# Patient Record
Sex: Male | Born: 1940 | Race: White | Hispanic: No | Marital: Single | Smoking: Former smoker
Health system: Southern US, Community
[De-identification: ages and names within clinical notes are randomized; demographics above are authoritative.]

## PROBLEM LIST (undated history)

## (undated) DIAGNOSIS — F329 Major depressive disorder, single episode, unspecified: Secondary | ICD-10-CM

## (undated) DIAGNOSIS — F32A Depression, unspecified: Secondary | ICD-10-CM

## (undated) HISTORY — PX: COLONOSCOPY: SHX174

## (undated) HISTORY — PX: KNEE SURGERY: SHX244

---

## 2004-10-14 ENCOUNTER — Ambulatory Visit: Payer: Self-pay | Admitting: Gastroenterology

## 2004-10-27 ENCOUNTER — Ambulatory Visit: Payer: Self-pay | Admitting: Gastroenterology

## 2004-12-06 ENCOUNTER — Emergency Department (HOSPITAL_COMMUNITY): Admission: EM | Admit: 2004-12-06 | Discharge: 2004-12-06 | Payer: Self-pay | Admitting: Emergency Medicine
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
## 2009-09-19 ENCOUNTER — Encounter (INDEPENDENT_AMBULATORY_CARE_PROVIDER_SITE_OTHER): Payer: Self-pay | Admitting: *Deleted

## 2009-09-23 ENCOUNTER — Encounter (INDEPENDENT_AMBULATORY_CARE_PROVIDER_SITE_OTHER): Payer: Self-pay | Admitting: *Deleted

## 2009-11-07 ENCOUNTER — Encounter (INDEPENDENT_AMBULATORY_CARE_PROVIDER_SITE_OTHER): Payer: Self-pay | Admitting: *Deleted

## 2009-11-10 ENCOUNTER — Ambulatory Visit: Payer: Self-pay | Admitting: Gastroenterology

## 2009-11-18 ENCOUNTER — Ambulatory Visit: Payer: Self-pay | Admitting: Gastroenterology

## 2010-04-28 NOTE — Procedures (Signed)
Summary: Colonoscopy  Patient: Benjamin Lowe Note: All result statuses are Final unless otherwise noted.  Tests: (1) Colonoscopy (COL)   COL Colonoscopy           DONE (C)     [facility]     [address]     [place], [place]  [address]           COLONOSCOPY PROCEDURE REPORT           PATIENT:  Benjamin Lowe, Benjamin Lowe  MR#:  [id number]     BIRTHDATE:  1941-01-22, 69 yrs. old  GENDER:  male     ENDOSCOPIST:  Judie Petit T. Russella Dar, MD, [facility]           PROCEDURE DATE:  11/18/2009     PROCEDURE:  Colonoscopy 19147     ASA CLASS:  Class II     INDICATIONS:  1) surveillance and high-risk screening  2) family     history of colon cancer  3) follow-up of polyp, polyp not     retrieved at 10/2004 colonoscopy. Father with colon cancer at 75.     MEDICATIONS:   Fentanyl 100 mcg IV, Versed 8 mg IV     DESCRIPTION OF PROCEDURE:   After the risks benefits and     alternatives of the procedure were thoroughly explained, informed     consent was obtained.  Digital rectal exam was performed and     revealed no abnormalities.   The LB PCF-H180AL [id number] endoscope     was introduced through the anus and advanced to the cecum, which     was identified by both the appendix and ileocecal valve, without     limitations.  The quality of the prep was good, using MoviPrep.     The instrument was then slowly withdrawn as the colon was fully     examined.     <<PROCEDUREIMAGES>>     FINDINGS:  A normal appearing cecum, ileocecal valve, and     appendiceal orifice were identified. The ascending, hepatic     flexure, transverse, splenic flexure, descending, sigmoid colon,     and rectum appeared unremarkable. Retroflexed views in the rectum     revealed no abnormalities. The time to cecum =  2  minutes. The     scope was then withdrawn (time =  9  min) from the patient and the     procedure completed.     COMPLICATIONS:  None           ENDOSCOPIC IMPRESSION:     1) Normal colon        RECOMMENDATIONS:     1) Repeat Colonoscopy in 5 years.           Venita Lick. Russella Dar, MD, [facility]           CC: Lilyan Punt, MD           n.     REVISED:  11/18/2009 11:22 AM     eSIGNED:   Judie Petit T. Stark at 11/18/2009 11:22 AM           Peri Maris, [id number]  Note: An exclamation mark (!) indicates a result that was not dispersed into the flowsheet. Document Creation Date: 11/18/2009 11:23 AM _______________________________________________________________________  (1) Order result status: Final Collection or observation date-time: 11/18/2009 11:10 Requested date-time:  Receipt date-time:  Reported date-time:  Referring Physician:   Ordering Physician: Claudette Head [phone]) Specimen Source:  Source: Launa Grill Order Number: [id number] Lab  site:   Appended Document: Colonoscopy    Clinical Lists Changes  Observations: Added new observation of COLONNXTDUE: 10/2014 (11/18/2009 14:42)

## 2010-04-28 NOTE — Miscellaneous (Signed)
Summary: recall col...em  Clinical Lists Changes  Medications: Added new medication of MOVIPREP 100 GM  SOLR (PEG-KCL-NACL-NASULF-NA ASC-C) As directed - Signed Rx of MOVIPREP 100 GM  SOLR (PEG-KCL-NACL-NASULF-NA ASC-C) As directed;  #1 x 0;  Signed;  Entered by: Clide Cliff RN;  Authorized by: Meryl Dare MD [facility];  Method used: Electronically to [facility] [address], [place], [place], [place]  [address], Ph: [phone], Fax: [phone] Observations: Added new observation of ALLERGY REV: Done (11/10/2009 9:41)    Prescriptions: MOVIPREP 100 GM  SOLR (PEG-KCL-NACL-NASULF-NA ASC-C) As directed  #1 x 0   Entered by:   Clide Cliff RN   Authorized by:   Meryl Dare MD [facility]   Signed by:   Clide Cliff RN on 11/10/2009   Method used:   Electronically to        [facility]  [address] (retail)       [address]       [place], [place]  [address]       Ph: [phone]       Fax: [phone]   RxID:   [id number]

## 2010-04-28 NOTE — Letter (Signed)
Summary: Previsit letter  [facility]  [address] [place], [place] [address]   Phone: [phone]  Fax: [phone]       09/23/2009 MRN: [id number]  [facility] [address] [place], [place]  [address]  Dear Benjamin Lowe,  Welcome to the Gastroenterology Division at [facility].    You are scheduled to see a nurse for your pre-procedure visit on November 10, 2009 at 10:00am on the 3rd floor at [facility], [address]  We ask that you try to arrive at our office 15 minutes prior to your appointment time to allow for check-in.  Your nurse visit will consist of discussing your medical and surgical history, your immediate family medical history, and your medications.    Please bring a complete list of all your medications or, if you prefer, bring the medication bottles and we will list them.  We will need to be aware of both prescribed and over the counter drugs.  We will need to know exact dosage information as well.  If you are on blood thinners (Coumadin, Plavix, Aggrenox, Ticlid, etc.) please call our office today/prior to your appointment, as we need to consult with your physician about holding your medication.   Please be prepared to read and sign documents such as consent forms, a financial agreement, and acknowledgement forms.  If necessary, and with your consent, a friend or relative is welcome to sit-in on the nurse visit with you.  Please bring your insurance card so that we may make a copy of it.  If your insurance requires a referral to see a specialist, please bring your referral form from your primary care physician.  No co-pay is required for this nurse visit.     If you cannot keep your appointment, please call [phone] to cancel or reschedule prior to your appointment date.  This allows [place] the opportunity to schedule an appointment for another patient in need of care.    Thank you for choosing [facility] for your medical  needs.  We appreciate the opportunity to care for you.  Please visit [place] at our website  to learn more about our practice.                     Sincerely.                                                                                                                   The Gastroenterology Division

## 2010-04-28 NOTE — Letter (Signed)
Summary: Colonoscopy Letter  [facility]  [address] [place], [place] [address]   Phone: [phone]  Fax: [phone]      September 19, 2009 MRN: [id number]   [facility] [address] [place], [place]  [address]   Dear Mr. Gorin,   According to your medical record, it is time for you to schedule a Colonoscopy. The American Cancer Society recommends this procedure as a method to detect early colon cancer. Patients with a family history of colon cancer, or a personal history of colon polyps or inflammatory bowel disease are at increased risk.  This letter has beeen generated based on the recommendations made at the time of your procedure. If you feel that in your particular situation this may no longer apply, please contact our office.  Please call our office at [phone] to schedule this appointment or to update your records at your earliest convenience.  Thank you for cooperating with [place] to provide you with the very best care possible.   Sincerely,  Judie Petit T. Russella Dar, M.D.  [facility] [phone]

## 2010-04-28 NOTE — Letter (Signed)
Summary: [facility] Instructions  [facility]  [address] [place], [place] [address]   Phone: [phone]  Fax: [phone]       ZAXTON ANGERER    [birth year]    MRN: [id number]        Procedure Day Dorna Bloom:  Jake Shark  11/18/09     Arrival Time:  9:30AM     Procedure Time:  10:30AM     Location of Procedure:                    _X _  [facility] (4th Floor)                       PREPARATION FOR COLONOSCOPY WITH MOVIPREP   Starting 5 days prior to your procedure 11/13/09 do not eat nuts, seeds, popcorn, corn, beans, peas,  salads, or any raw vegetables.  Do not take any fiber supplements (e.g. Metamucil, Citrucel, and Benefiber).  THE DAY BEFORE YOUR PROCEDURE         DATE: 11/17/09  DAY: MONDAY  1.  Drink clear liquids the entire day-NO SOLID FOOD  2.  Do not drink anything colored red or purple.  Avoid juices with pulp.  No orange juice.  3.  Drink at least 64 oz. (8 glasses) of fluid/clear liquids during the day to prevent dehydration and help the prep work efficiently.  CLEAR LIQUIDS INCLUDE: Water Jello Ice Popsicles Tea (sugar ok, no milk/cream) Powdered fruit flavored drinks Coffee (sugar ok, no milk/cream) Gatorade Juice: apple, white grape, white cranberry  Lemonade Clear bullion, consomm, broth Carbonated beverages (any kind) Strained chicken noodle soup Hard Candy                             4.  In the morning, mix first dose of MoviPrep solution:    Empty 1 Pouch A and 1 Pouch B into the disposable container    Add lukewarm drinking water to the top line of the container. Mix to dissolve    Refrigerate (mixed solution should be used within 24 hrs)  5.  Begin drinking the prep at 5:00 p.m. The MoviPrep container is divided by 4 marks.   Every 15 minutes drink the solution down to the next mark (approximately 8 oz) until the full liter is complete.   6.  Follow completed prep with 16 oz of clear liquid of your choice  (Nothing red or purple).  Continue to drink clear liquids until bedtime.  7.  Before going to bed, mix second dose of MoviPrep solution:    Empty 1 Pouch A and 1 Pouch B into the disposable container    Add lukewarm drinking water to the top line of the container. Mix to dissolve    Refrigerate  THE DAY OF YOUR PROCEDURE      DATE: 11/18/09   DAY: TUESDAY  Beginning at 5:30AM (5 hours before procedure):         1. Every 15 minutes, drink the solution down to the next mark (approx 8 oz) until the full liter is complete.  2. Follow completed prep with 16 oz. of clear liquid of your choice.    3. You may drink clear liquids until 8:30AM (2 HOURS BEFORE PROCEDURE).   MEDICATION INSTRUCTIONS  Unless otherwise instructed, you should take regular prescription medications with a small sip of water   as early as possible the morning  of your procedure.         OTHER INSTRUCTIONS  You will need a responsible adult at least 71 years of age to accompany you and drive you home.   This person must remain in the waiting room during your procedure.  Wear loose fitting clothing that is easily removed.  Leave jewelry and other valuables at home.  However, you may wish to bring a book to read or  an iPod/MP3 player to listen to music as you wait for your procedure to start.  Remove all body piercing jewelry and leave at home.  Total time from sign-in until discharge is approximately 2-3 hours.  You should go home directly after your procedure and rest.  You can resume normal activities the  day after your procedure.  The day of your procedure you should not:   Drive   Make legal decisions   Operate machinery   Drink alcohol   Return to work  You will receive specific instructions about eating, activities and medications before you leave.    The above instructions have been reviewed and explained to me by   Clide Cliff, RN______________________    I fully understand  and can verbalize these instructions _____________________________ Date _________

## 2012-09-04 ENCOUNTER — Telehealth: Payer: Self-pay | Admitting: Family Medicine

## 2012-09-04 NOTE — Telephone Encounter (Signed)
Patient needs paperwork for blood work.  Please call patient paperwork is ready to be picked up.  Thanks

## 2012-09-05 ENCOUNTER — Encounter: Payer: Self-pay | Admitting: *Deleted

## 2012-09-05 ENCOUNTER — Other Ambulatory Visit: Payer: Self-pay | Admitting: *Deleted

## 2012-09-05 DIAGNOSIS — Z125 Encounter for screening for malignant neoplasm of prostate: Secondary | ICD-10-CM

## 2012-09-05 DIAGNOSIS — Z0000 Encounter for general adult medical examination without abnormal findings: Secondary | ICD-10-CM

## 2012-09-05 NOTE — Telephone Encounter (Signed)
bloodwork papers for Lipid, liver, psa, and bmp ready for pick up pt notified

## 2012-09-28 LAB — BASIC METABOLIC PANEL
Chloride: 103 mEq/L (ref 96–112)
Potassium: 4.2 mEq/L (ref 3.5–5.3)
Sodium: 140 mEq/L (ref 135–145)

## 2012-09-28 LAB — LIPID PANEL
HDL: 45 mg/dL (ref >39)
Total CHOL/HDL Ratio: 3.8 Ratio
Triglycerides: 107 mg/dL (ref <150)
VLDL: 21 mg/dL (ref 0–40)

## 2012-09-28 LAB — HEPATIC FUNCTION PANEL
AST: 16 U/L (ref 0–37)
Total Bilirubin: 0.8 mg/dL (ref 0.3–1.2)

## 2012-10-23 ENCOUNTER — Ambulatory Visit (INDEPENDENT_AMBULATORY_CARE_PROVIDER_SITE_OTHER): Payer: No Typology Code available for payment source | Admitting: Family Medicine

## 2012-10-23 ENCOUNTER — Encounter: Payer: Self-pay | Admitting: Family Medicine

## 2012-10-23 VITALS — BP 148/84 | HR 70 | Ht 68.5 in | Wt 183.0 lb

## 2012-10-23 DIAGNOSIS — F411 Generalized anxiety disorder: Secondary | ICD-10-CM

## 2012-10-23 DIAGNOSIS — Z0000 Encounter for general adult medical examination without abnormal findings: Secondary | ICD-10-CM

## 2012-10-23 NOTE — Progress Notes (Signed)
  Subjective:    Patient ID: Benjamin Lowe, male    DOB: November 21, 1940, 72 y.o.   MRN: [id number]  HPI Here for a physical. Here for wellness exam via Medicare. No accidents. Recall is 3 for 3 clock drawing good. He denies any specific problems or issues currently. Up-to-date on colonoscopy at this present moment he does not want to do one at [place] again. He eats properly exercises on a regular basis. Safety measures dietary measures all discussed.  Review of Systems  Constitutional: Negative for fever, activity change and appetite change.  HENT: Negative for congestion, rhinorrhea and neck pain.   Eyes: Negative for discharge.  Respiratory: Negative for cough and wheezing.   Cardiovascular: Negative for chest pain.  Gastrointestinal: Negative for vomiting, abdominal pain and blood in stool.  Genitourinary: Negative for frequency and difficulty urinating.  Skin: Negative for rash.  Allergic/Immunologic: Negative for environmental allergies and food allergies.  Neurological: Negative for weakness and headaches.  Psychiatric/Behavioral: Negative for agitation.   Past medical history family history social history all reviewed    Objective:   Physical Exam  Constitutional: He appears well-developed and well-nourished.  HENT:  Head: Normocephalic and atraumatic.  Right Ear: External ear normal.  Left Ear: External ear normal.  Nose: Nose normal.  Mouth/Throat: Oropharynx is clear and moist.  Eyes: EOM are normal. Pupils are equal, round, and reactive to light.  Neck: Normal range of motion. Neck supple. No thyromegaly present.  Cardiovascular: Normal rate, regular rhythm and normal heart sounds.   No murmur heard. Pulmonary/Chest: Effort normal and breath sounds normal. No respiratory distress. He has no wheezes.  Abdominal: Soft. Bowel sounds are normal. He exhibits no distension and no mass. There is no tenderness.  Genitourinary: Penis normal.  Musculoskeletal: Normal range of  motion. He exhibits no edema.  Lymphadenopathy:    He has no cervical adenopathy.  Neurological: He is alert. He exhibits normal muscle tone.  Skin: Skin is warm and dry. No erythema.  Psychiatric: He has a normal mood and affect. His behavior is normal. Judgment normal.          Assessment & Plan:  Patient most at risk for accidents just mainly based on age. I encouraged him to drive safe thing through anything he is doing.  Dietary measures discussed in detail.  Followup for wellness exam in one year.

## 2012-10-28 ENCOUNTER — Other Ambulatory Visit: Payer: Self-pay | Admitting: Family Medicine

## 2012-12-21 ENCOUNTER — Telehealth: Payer: Self-pay | Admitting: Family Medicine

## 2012-12-21 MED ORDER — MELOXICAM 15 MG PO TABS: 15.0000 mg | ORAL_TABLET | Freq: Every day | ORAL | Status: DC | PRN

## 2012-12-21 NOTE — Telephone Encounter (Signed)
May call in Mobic 15 mg, #30, 1 daily as needed followup if ongoing

## 2012-12-21 NOTE — Telephone Encounter (Signed)
Patient is wanting to know if he can take 2 aleve for his sciatic pain .Marland Kitchen Also, other options for something stronger if needed.

## 2012-12-21 NOTE — Telephone Encounter (Signed)
Left message on voicemail notifying patient that medication has been sent to pharmacy.  

## 2012-12-21 NOTE — Telephone Encounter (Signed)
Patient states that the aleve is not helping him much at all. He wants to know if you can prescribe something stronger for the pain.

## 2012-12-21 NOTE — Telephone Encounter (Signed)
Yes for occasional use

## 2013-01-17 ENCOUNTER — Other Ambulatory Visit: Payer: Self-pay | Admitting: Family Medicine

## 2013-01-17 NOTE — Telephone Encounter (Signed)
Refill x4 

## 2013-03-20 ENCOUNTER — Other Ambulatory Visit: Payer: Self-pay | Admitting: Family Medicine

## 2013-03-20 NOTE — Telephone Encounter (Signed)
Last office visit 10/23/12

## 2013-05-17 ENCOUNTER — Ambulatory Visit (INDEPENDENT_AMBULATORY_CARE_PROVIDER_SITE_OTHER): Payer: Medicare HMO | Admitting: Family Medicine

## 2013-05-17 ENCOUNTER — Encounter: Payer: Self-pay | Admitting: Family Medicine

## 2013-05-17 VITALS — BP 130/94 | Temp 98.6°F | Ht 68.5 in | Wt 180.4 lb

## 2013-05-17 DIAGNOSIS — K2990 Gastroduodenitis, unspecified, without bleeding: Principal | ICD-10-CM

## 2013-05-17 DIAGNOSIS — K2970 Gastritis, unspecified, without bleeding: Secondary | ICD-10-CM

## 2013-05-17 MED ORDER — PANTOPRAZOLE SODIUM 40 MG PO TBEC: 40.0000 mg | DELAYED_RELEASE_TABLET | Freq: Every day | ORAL | Status: DC

## 2013-05-17 MED ORDER — ALPRAZOLAM 0.25 MG PO TABS: ORAL_TABLET | ORAL | Status: DC

## 2013-05-17 MED ORDER — SILDENAFIL CITRATE 100 MG PO TABS: ORAL_TABLET | ORAL | Status: DC

## 2013-05-17 NOTE — Progress Notes (Signed)
   Subjective:    Patient ID: Benjamin Lowe, male    DOB: [birth year], 73 y.o.   MRN: [id number]  Abdominal Pain This is a new problem. The current episode started 1 to 4 weeks ago. The problem occurs intermittently. The problem has been unchanged. The pain is located in the generalized abdominal region. The pain is at a severity of 0/10. The patient is experiencing no pain. The quality of the pain is a sensation of fullness. The abdominal pain does not radiate. Associated symptoms include belching, flatus and nausea. Pertinent negatives include no fever or vomiting. Nothing aggravates the pain. The pain is relieved by passing flatus and belching. He has tried antacids for the symptoms. The treatment provided no relief.   PMH benign/anxiety tendencies   Review of Systems  Constitutional: Negative for fever and activity change.  HENT: Negative for congestion, ear pain and rhinorrhea.   Eyes: Negative for discharge.  Respiratory: Positive for cough. Negative for wheezing.   Cardiovascular: Negative for chest pain.  Gastrointestinal: Positive for nausea, abdominal pain and flatus. Negative for vomiting and blood in stool.       Objective:   Physical Exam  Nursing note and vitals reviewed. Constitutional: He appears well-developed.  HENT:  Head: Normocephalic.  Mouth/Throat: Oropharynx is clear and moist. No oropharyngeal exudate.  Neck: Normal range of motion.  Cardiovascular: Normal rate, regular rhythm and normal heart sounds.   No murmur heard. Pulmonary/Chest: Effort normal and breath sounds normal. He has no wheezes.  Abdominal: Soft. He exhibits no distension and no mass. There is no tenderness. There is no rebound and no guarding.  Lymphadenopathy:    He has no cervical adenopathy.  Neurological: He exhibits normal muscle tone.  Skin: Skin is warm and dry.          Assessment & Plan:  Abdominal discomfort-intermittent mainly epigastric PP I prescribed. Warning signs  discussed. Patient has tendency to worry this complicates things I did advise the patient to followup again in 3-4 weeks if he is not improving we will do some testing possibly ultrasound possibly lab work but for now this is not necessary. 25 minutes spent with patient discussing these issues  Refills on Xanax and Viagra given

## 2013-06-07 ENCOUNTER — Ambulatory Visit (INDEPENDENT_AMBULATORY_CARE_PROVIDER_SITE_OTHER): Payer: Medicare HMO | Admitting: Family Medicine

## 2013-06-07 ENCOUNTER — Encounter: Payer: Self-pay | Admitting: Family Medicine

## 2013-06-07 VITALS — BP 148/80 | Ht 68.5 in | Wt 178.0 lb

## 2013-06-07 DIAGNOSIS — K2970 Gastritis, unspecified, without bleeding: Secondary | ICD-10-CM

## 2013-06-07 DIAGNOSIS — K2990 Gastroduodenitis, unspecified, without bleeding: Principal | ICD-10-CM

## 2013-06-07 NOTE — Progress Notes (Signed)
   Subjective:    Patient ID: Benjamin Lowe, male    DOB: 09-19-1940, 73 y.o.   MRN: [id number]  HPI Patient is here today for a f/u on his GI problems. Patient is nervous about what's going on he worries about dying.  He said he is feeling better and no longer has abdominal pain.   Patient denies rectal bleeding vomiting diarrhea sweats chills or fever.  Review of Systems    see above. Objective:   Physical Exam  Lungs clear hearts regular and soft no guarding rebound tenderness  Blood pressure recheck very good.    Assessment & Plan:  Gastritis-resolved. Patient is take protonic for a total of 3 months then stop the medicine. Followup if ongoing trouble followup later this year for a wellness exam.

## 2013-08-23 ENCOUNTER — Other Ambulatory Visit: Payer: Self-pay | Admitting: Family Medicine

## 2013-09-20 ENCOUNTER — Telehealth: Payer: Self-pay | Admitting: Family Medicine

## 2013-09-20 DIAGNOSIS — Z125 Encounter for screening for malignant neoplasm of prostate: Secondary | ICD-10-CM

## 2013-09-20 DIAGNOSIS — Z0189 Encounter for other specified special examinations: Secondary | ICD-10-CM

## 2013-09-20 DIAGNOSIS — Z79899 Other long term (current) drug therapy: Secondary | ICD-10-CM

## 2013-09-20 NOTE — Telephone Encounter (Signed)
BW orders in. Patient notified and verbalized understanding

## 2013-09-20 NOTE — Telephone Encounter (Signed)
Lipid, Liver, Met 7, PSA on 09/27/12

## 2013-09-20 NOTE — Telephone Encounter (Signed)
Same test but add CBC

## 2013-09-20 NOTE — Telephone Encounter (Signed)
Pt would like bw papers for appt   Call when done

## 2013-09-21 LAB — LIPID PANEL
CHOL/HDL RATIO: 3.6 ratio
Cholesterol: 185 mg/dL (ref 0–200)
HDL: 51 mg/dL (ref >39)
LDL CALC: 107 mg/dL — AB (ref 0–99)
TRIGLYCERIDES: 134 mg/dL (ref <150)
VLDL: 27 mg/dL (ref 0–40)

## 2013-09-21 LAB — HEPATIC FUNCTION PANEL
ALBUMIN: 4.3 g/dL (ref 3.5–5.2)
ALK PHOS: 74 U/L (ref 39–117)
ALT: 13 U/L (ref 0–53)
AST: 15 U/L (ref 0–37)
BILIRUBIN INDIRECT: 0.6 mg/dL (ref 0.2–1.2)
Bilirubin, Direct: 0.2 mg/dL (ref 0.0–0.3)
Total Bilirubin: 0.8 mg/dL (ref 0.2–1.2)
Total Protein: 7.4 g/dL (ref 6.0–8.3)

## 2013-09-21 LAB — BASIC METABOLIC PANEL
BUN: 19 mg/dL (ref 6–23)
CHLORIDE: 109 meq/L (ref 96–112)
CO2: 28 mEq/L (ref 19–32)
Calcium: 9.4 mg/dL (ref 8.4–10.5)
Creat: 0.93 mg/dL (ref 0.50–1.35)
GLUCOSE: 82 mg/dL (ref 70–99)
Potassium: 4.3 mEq/L (ref 3.5–5.3)
Sodium: 138 mEq/L (ref 135–145)

## 2013-09-21 LAB — CBC
HEMATOCRIT: 46.6 % (ref 39.0–52.0)
Hemoglobin: 15.7 g/dL (ref 13.0–17.0)
MCH: 31.6 pg (ref 26.0–34.0)
MCHC: 33.7 g/dL (ref 30.0–36.0)
MCV: 93.8 fL (ref 78.0–100.0)
Platelets: 194 10*3/uL (ref 150–400)
RBC: 4.97 MIL/uL (ref 4.22–5.81)
RDW: 13.3 % (ref 11.5–15.5)
WBC: 5.2 10*3/uL (ref 4.0–10.5)

## 2013-09-22 LAB — PSA, MEDICARE: PSA: 0.43 ng/mL (ref <=4.00)

## 2013-10-30 ENCOUNTER — Encounter: Payer: Self-pay | Admitting: Family Medicine

## 2013-10-30 ENCOUNTER — Ambulatory Visit (INDEPENDENT_AMBULATORY_CARE_PROVIDER_SITE_OTHER): Payer: Medicare HMO | Admitting: Family Medicine

## 2013-10-30 VITALS — BP 124/88 | Ht 69.0 in | Wt 180.0 lb

## 2013-10-30 DIAGNOSIS — Z0000 Encounter for general adult medical examination without abnormal findings: Secondary | ICD-10-CM

## 2013-10-30 DIAGNOSIS — Z23 Encounter for immunization: Secondary | ICD-10-CM

## 2013-10-30 MED ORDER — ALPRAZOLAM 0.25 MG PO TABS: ORAL_TABLET | ORAL | Status: DC

## 2013-10-30 MED ORDER — SILDENAFIL CITRATE 100 MG PO TABS: ORAL_TABLET | ORAL | Status: DC

## 2013-10-30 NOTE — Progress Notes (Signed)
   Subjective:    Patient ID: Benjamin Lowe, male    DOB: November 09, 1940, 73 y.o.   MRN: [id number]  HPI AWV- Annual Wellness Visit  The patient was seen for their annual wellness visit. The patient's past medical history, surgical history, and family history were reviewed. Pertinent vaccines were reviewed ( tetanus, pneumonia, shingles, flu) The patient's medication list was reviewed and updated.  The height and weight were entered. The patient's current BMI is: 26.58  Cognitive screening was completed. Outcome of Mini - Cog: passed  Falls within the past 6 months: none  Current tobacco usage: chew tobacco (All patients who use tobacco were given written and verbal information on quitting)  Recent listing of emergency department/hospitalizations over the past year were reviewed.  current specialist the patient sees on a regular basis: none   Medicare annual wellness visit patient questionnaire was reviewed.  A written screening schedule for the patient for the next 5-10 years was given. Appropriate discussion of followup regarding next visit was discussed.  Patient states he has no concerns at this time.     Review of Systems  Constitutional: Negative for fever, activity change and appetite change.  HENT: Negative for congestion and rhinorrhea.   Eyes: Negative for discharge.  Respiratory: Negative for cough and wheezing.   Cardiovascular: Negative for chest pain.  Gastrointestinal: Negative for vomiting, abdominal pain and blood in stool.  Genitourinary: Negative for frequency and difficulty urinating.  Musculoskeletal: Negative for neck pain.  Skin: Negative for rash.  Allergic/Immunologic: Negative for environmental allergies and food allergies.  Neurological: Negative for weakness and headaches.  Psychiatric/Behavioral: Negative for agitation.       Objective:   Physical Exam  Constitutional: He appears well-developed and well-nourished.  HENT:  Head:  Normocephalic and atraumatic.  Right Ear: External ear normal.  Left Ear: External ear normal.  Nose: Nose normal.  Mouth/Throat: Oropharynx is clear and moist.  Eyes: EOM are normal. Pupils are equal, round, and reactive to light.  Neck: Normal range of motion. Neck supple. No thyromegaly present.  Cardiovascular: Normal rate, regular rhythm and normal heart sounds.   No murmur heard. Pulmonary/Chest: Effort normal and breath sounds normal. No respiratory distress. He has no wheezes.  Abdominal: Soft. Bowel sounds are normal. He exhibits no distension and no mass. There is no tenderness.  Genitourinary: Rectum normal, prostate normal and penis normal.  Musculoskeletal: Normal range of motion. He exhibits no edema.  Lymphadenopathy:    He has no cervical adenopathy.  Neurological: He is alert. He exhibits normal muscle tone.  Skin: Skin is warm and dry. No erythema.  Psychiatric: He has a normal mood and affect. His behavior is normal. Judgment normal.          Assessment & Plan:  His overall health is doing very well he needs to continue healthy diet regular physical activity. There is no need for this patient to followup any time soon recheck 6 months with a wellness in one year. He is not due for any colonoscopy currently and was told so.

## 2013-12-06 ENCOUNTER — Other Ambulatory Visit: Payer: Self-pay | Admitting: Family Medicine

## 2013-12-06 NOTE — Telephone Encounter (Signed)
6 mo ok 

## 2013-12-06 NOTE — Telephone Encounter (Signed)
Last seen 10/30/13.

## 2014-05-16 ENCOUNTER — Encounter: Payer: Self-pay | Admitting: Family Medicine

## 2014-05-16 ENCOUNTER — Ambulatory Visit (INDEPENDENT_AMBULATORY_CARE_PROVIDER_SITE_OTHER): Payer: Medicare HMO | Admitting: Family Medicine

## 2014-05-16 VITALS — BP 148/86 | Ht 69.0 in | Wt 182.6 lb

## 2014-05-16 DIAGNOSIS — K219 Gastro-esophageal reflux disease without esophagitis: Secondary | ICD-10-CM

## 2014-05-16 MED ORDER — PANTOPRAZOLE SODIUM 40 MG PO TBEC: 40.0000 mg | DELAYED_RELEASE_TABLET | Freq: Every day | ORAL | Status: DC

## 2014-05-16 MED ORDER — ALPRAZOLAM 0.25 MG PO TABS: ORAL_TABLET | ORAL | Status: DC

## 2014-05-16 NOTE — Progress Notes (Signed)
   Subjective:    Patient ID: Benjamin Lowe, male    DOB: Jun 19, 1940, 74 y.o.   MRN: [id number]  HPI Patient arrives for a follow up on protonix and xanax. He states occasional reflux issues but if he watches his diet and takes his medication does not have problems he states he does try to stay physically active. He does try to watch how he eats. He states his moods are doing good. He denies any chest tightness pressure pain shortness breath.  Review of Systems  Constitutional: Negative for activity change, appetite change and fatigue.  HENT: Negative for congestion.   Respiratory: Negative for cough.   Cardiovascular: Negative for chest pain.  Gastrointestinal: Negative for abdominal pain.  Endocrine: Negative for polydipsia and polyphagia.  Neurological: Negative for weakness.  Psychiatric/Behavioral: Negative for confusion.       Objective:   Physical Exam  Constitutional: He appears well-nourished. No distress.  Cardiovascular: Normal rate, regular rhythm and normal heart sounds.   No murmur heard. Pulmonary/Chest: Effort normal and breath sounds normal. No respiratory distress.  Musculoskeletal: He exhibits no edema.  Lymphadenopathy:    He has no cervical adenopathy.  Neurological: He is alert.  Psychiatric: His behavior is normal.  Vitals reviewed.         Assessment & Plan:  Benjamin Lowe overall is doing fairly well reflux doing well taking medication and states it keeps it under control when he does not take it he does have problems.  Anxiety issues stable once refills on medicines refill with 5 additional refills given he should follow-up in the summer for a wellness exam

## 2014-09-10 ENCOUNTER — Telehealth: Payer: Self-pay | Admitting: Family Medicine

## 2014-09-10 DIAGNOSIS — R5383 Other fatigue: Secondary | ICD-10-CM

## 2014-09-10 DIAGNOSIS — Z13220 Encounter for screening for lipoid disorders: Secondary | ICD-10-CM

## 2014-09-10 DIAGNOSIS — Z125 Encounter for screening for malignant neoplasm of prostate: Secondary | ICD-10-CM

## 2014-09-10 DIAGNOSIS — Z79899 Other long term (current) drug therapy: Secondary | ICD-10-CM

## 2014-09-10 NOTE — Telephone Encounter (Signed)
Left message on voicemail notifying patient bloodwork has been ordered.  

## 2014-09-10 NOTE — Telephone Encounter (Signed)
Rep same 

## 2014-09-10 NOTE — Telephone Encounter (Signed)
Pt is requesting lab orders to be sent over for his physical in the beginning of Aug. Last labs per epic were: psa,bmp,lipid,hepatic,cbc, on 09/21/13

## 2014-09-12 ENCOUNTER — Encounter: Payer: Self-pay | Admitting: Gastroenterology

## 2014-09-17 ENCOUNTER — Encounter: Payer: Self-pay | Admitting: Family Medicine

## 2014-09-17 LAB — BASIC METABOLIC PANEL
BUN / CREAT RATIO: 16 (ref 10–22)
BUN: 16 mg/dL (ref 8–27)
CHLORIDE: 100 mmol/L (ref 97–108)
CO2: 24 mmol/L (ref 18–29)
CREATININE: 0.97 mg/dL (ref 0.76–1.27)
Calcium: 8.9 mg/dL (ref 8.6–10.2)
GFR calc non Af Amer: 77 mL/min/{1.73_m2} (ref >59)
GFR, EST AFRICAN AMERICAN: 89 mL/min/{1.73_m2} (ref >59)
GLUCOSE: 82 mg/dL (ref 65–99)
Potassium: 4.3 mmol/L (ref 3.5–5.2)
Sodium: 139 mmol/L (ref 134–144)

## 2014-09-17 LAB — CBC WITH DIFFERENTIAL/PLATELET
BASOS ABS: 0 10*3/uL (ref 0.0–0.2)
BASOS: 0 %
EOS (ABSOLUTE): 0.1 10*3/uL (ref 0.0–0.4)
Eos: 1 %
Hematocrit: 45.3 % (ref 37.5–51.0)
Hemoglobin: 15.6 g/dL (ref 12.6–17.7)
IMMATURE GRANS (ABS): 0 10*3/uL (ref 0.0–0.1)
Immature Granulocytes: 0 %
LYMPHS: 26 %
Lymphocytes Absolute: 1.9 10*3/uL (ref 0.7–3.1)
MCH: 32 pg (ref 26.6–33.0)
MCHC: 34.4 g/dL (ref 31.5–35.7)
MCV: 93 fL (ref 79–97)
MONOCYTES: 9 %
Monocytes Absolute: 0.7 10*3/uL (ref 0.1–0.9)
NEUTROS ABS: 4.6 10*3/uL (ref 1.4–7.0)
NEUTROS PCT: 64 %
Platelets: 201 10*3/uL (ref 150–379)
RBC: 4.87 x10E6/uL (ref 4.14–5.80)
RDW: 13.5 % (ref 12.3–15.4)
WBC: 7.4 10*3/uL (ref 3.4–10.8)

## 2014-09-17 LAB — HEPATIC FUNCTION PANEL
ALBUMIN: 4.4 g/dL (ref 3.5–4.8)
ALT: 13 IU/L (ref 0–44)
AST: 17 IU/L (ref 0–40)
Alkaline Phosphatase: 84 IU/L (ref 39–117)
BILIRUBIN TOTAL: 0.7 mg/dL (ref 0.0–1.2)
BILIRUBIN, DIRECT: 0.17 mg/dL (ref 0.00–0.40)
Total Protein: 7.3 g/dL (ref 6.0–8.5)

## 2014-09-17 LAB — LIPID PANEL
CHOLESTEROL TOTAL: 198 mg/dL (ref 100–199)
Chol/HDL Ratio: 3.8 ratio units (ref 0.0–5.0)
HDL: 52 mg/dL (ref >39)
LDL CALC: 121 mg/dL — AB (ref 0–99)
TRIGLYCERIDES: 124 mg/dL (ref 0–149)
VLDL CHOLESTEROL CAL: 25 mg/dL (ref 5–40)

## 2014-09-17 LAB — PSA: Prostate Specific Ag, Serum: 0.4 ng/mL (ref 0.0–4.0)

## 2014-09-23 ENCOUNTER — Encounter: Payer: Self-pay | Admitting: Gastroenterology

## 2014-11-04 ENCOUNTER — Ambulatory Visit (INDEPENDENT_AMBULATORY_CARE_PROVIDER_SITE_OTHER): Payer: Medicare HMO | Admitting: Family Medicine

## 2014-11-04 ENCOUNTER — Encounter: Payer: Self-pay | Admitting: Family Medicine

## 2014-11-04 VITALS — BP 130/82 | Ht 69.0 in | Wt 180.0 lb

## 2014-11-04 DIAGNOSIS — E785 Hyperlipidemia, unspecified: Secondary | ICD-10-CM | POA: Diagnosis not present

## 2014-11-04 DIAGNOSIS — M25572 Pain in left ankle and joints of left foot: Secondary | ICD-10-CM | POA: Diagnosis not present

## 2014-11-04 DIAGNOSIS — Z0000 Encounter for general adult medical examination without abnormal findings: Secondary | ICD-10-CM

## 2014-11-04 DIAGNOSIS — F411 Generalized anxiety disorder: Secondary | ICD-10-CM

## 2014-11-04 NOTE — Progress Notes (Signed)
Subjective:    Patient ID: Benjamin Lowe, male    DOB: September 04, 1940, 74 y.o.   MRN: [id number]  HPI  AWV- Annual Wellness Visit  The patient was seen for their annual wellness visit. The patient's past medical history, surgical history, and family history were reviewed. Pertinent vaccines were reviewed ( tetanus, pneumonia, shingles, flu) The patient's medication list was reviewed and updated.  The height and weight were entered. The patient's current BMI is:26.58  Cognitive screening was completed. Outcome of Mini - Cog: pass  Falls within the past 6 months:nonw  Current tobacco usage: none (All patients who use tobacco were given written and verbal information on quitting)  Recent listing of emergency department/hospitalizations over the past year were reviewed.  current specialist the patient sees on a regular basis: no   Medicare annual wellness visit patient questionnaire was reviewed.  A written screening schedule for the patient for the next 5-10 years was given. Appropriate discussion of followup regarding next visit was discussed.  Ankle pain from sprain 8 years ago-has to take alleve about every 3 days to keep active. Discuss recent labs we discussed his cholesterol the LDL slightly elevated HDL looks good sugar looks good patient does try to exercise on a regular basis with walking.  Twisted ankle 8 yr ago, pain off and on recently, uses aleve 2 days per week this seems to help. He denies any swelling of it. Does not wake him up at night.  Review of Systems  Constitutional: Negative for fever, activity change and appetite change.  HENT: Negative for congestion and rhinorrhea.   Eyes: Negative for discharge.  Respiratory: Negative for cough and wheezing.   Cardiovascular: Negative for chest pain.  Gastrointestinal: Negative for vomiting, abdominal pain and blood in stool.  Genitourinary: Negative for frequency and difficulty urinating.  Musculoskeletal: Positive  for arthralgias. Negative for neck pain.  Skin: Negative for rash.  Allergic/Immunologic: Negative for environmental allergies and food allergies.  Neurological: Negative for weakness and headaches.  Psychiatric/Behavioral: Negative for agitation.       Objective:   Physical Exam  Constitutional: He appears well-developed and well-nourished.  HENT:  Head: Normocephalic and atraumatic.  Right Ear: External ear normal.  Left Ear: External ear normal.  Nose: Nose normal.  Mouth/Throat: Oropharynx is clear and moist.  Eyes: EOM are normal. Pupils are equal, round, and reactive to light.  Neck: Normal range of motion. Neck supple. No thyromegaly present.  Cardiovascular: Normal rate, regular rhythm and normal heart sounds.   No murmur heard. Pulmonary/Chest: Effort normal and breath sounds normal. No respiratory distress. He has no wheezes.  Abdominal: Soft. Bowel sounds are normal. He exhibits no distension and no mass. There is no tenderness.  Genitourinary: Penis normal.  Musculoskeletal: Normal range of motion. He exhibits no edema.  Lymphadenopathy:    He has no cervical adenopathy.  Neurological: He is alert. He exhibits normal muscle tone.  Skin: Skin is warm and dry. No erythema.  Psychiatric: He has a normal mood and affect. His behavior is normal. Judgment normal.          Assessment & Plan:  1. Routine general medical examination at a health care facility Patient does not one to do colonoscopy. He does agree to do Hemoccults cards 3. We also talked about the importance of exercising watching diet closely. - POC Hemoccult Bld/Stl (3-Cd Home Screen); Future  2. Pain, ankle, left Intermittent pain in the left ankle sprain 8 years ago ligaments are stable  no abnormality seen on the exam he uses Aleve 1 or 2 per week I don't believe he needs x-rays currently  3. Hyperlipidemia Hyperlipidemia-lab work shows slight elevation of LDL in June. Watch diet closely. No  medications necessary.  4. Generalized anxiety disorder Use a small dose of Xanax once or twice per day does not abuse it has had lifelong chronic anxiety. Denies depression.

## 2014-11-13 ENCOUNTER — Other Ambulatory Visit: Payer: Self-pay | Admitting: *Deleted

## 2014-11-13 DIAGNOSIS — Z0000 Encounter for general adult medical examination without abnormal findings: Secondary | ICD-10-CM

## 2014-11-13 LAB — POC HEMOCCULT BLD/STL (HOME/3-CARD/SCREEN)
FECAL OCCULT BLD: NEGATIVE
FECAL OCCULT BLD: NEGATIVE
Fecal Occult Blood, POC: NEGATIVE

## 2015-01-17 ENCOUNTER — Other Ambulatory Visit: Payer: Self-pay | Admitting: Family Medicine

## 2015-01-17 NOTE — Telephone Encounter (Signed)
May have this and 4 refills 

## 2015-05-09 ENCOUNTER — Ambulatory Visit (INDEPENDENT_AMBULATORY_CARE_PROVIDER_SITE_OTHER): Payer: Medicare HMO | Admitting: Family Medicine

## 2015-05-09 ENCOUNTER — Encounter: Payer: Self-pay | Admitting: Family Medicine

## 2015-05-09 VITALS — BP 162/94 | Ht 69.0 in | Wt 190.1 lb

## 2015-05-09 DIAGNOSIS — R0789 Other chest pain: Secondary | ICD-10-CM

## 2015-05-09 DIAGNOSIS — R1013 Epigastric pain: Secondary | ICD-10-CM | POA: Diagnosis not present

## 2015-05-09 MED ORDER — SUCRALFATE 1 G PO TABS: 1.0000 g | ORAL_TABLET | Freq: Three times a day (TID) | ORAL | Status: DC

## 2015-05-09 NOTE — Progress Notes (Signed)
   Subjective:    Patient ID: Benjamin Lowe, male    DOB: 26-Apr-1940, 75 y.o.   MRN: [id number]  Chest Pain  This is a new problem. The current episode started 1 to 4 weeks ago.   Patient in today with c/o of abdominal pain, chest discomfort, and excessive flatulence.  When questioned in detail the chest discomfort is a bloating sensation that starts in the epigastric radiates up he denies any chest tightness pressure pain States no other concerns this visit.  Review of Systems  Cardiovascular: Positive for chest pain.  he relates a lot of reflux regurgitation belching nausea denies fever chills denies weight loss. Does not wake him up at night. No diarrhea.     Objective:   Physical Exam Lungs clear heart rate normal no murmurs her pulse normal abdomen soft no guarding or rebound extremities no edema skin warm dry epigastrium area is a region that he states he feels bloated and has a lot of belching  25 minutes was spent with the patient. Greater than half the time was spent in discussion and answering questions and counseling regarding the issues that the patient came in for today. The majority of the discussion was on his symptoms EKG and reassurance that he does not need lab work and he is not dying of any type of cancer certainly we would be open to testing if symptoms get worse recheck in 2 weeks     Assessment & Plan:  Epigastrium symptoms consistent with dyspepsia and regurgitation add Carafate for 2 weeks recheck in 2 weeks  Patient is burdened by anxiety fear of death I do not find any evidence of any type of cancer I do not recommend any lab work currently may use Xanax intermittently caution drowsiness I offered antidepressant states he will not does not want to take it he denies being depressed denies being suicidal

## 2015-06-05 ENCOUNTER — Ambulatory Visit (INDEPENDENT_AMBULATORY_CARE_PROVIDER_SITE_OTHER): Payer: Medicare HMO | Admitting: Family Medicine

## 2015-06-05 ENCOUNTER — Encounter: Payer: Self-pay | Admitting: Family Medicine

## 2015-06-05 VITALS — BP 110/80 | Ht 69.0 in | Wt 181.2 lb

## 2015-06-05 DIAGNOSIS — R1013 Epigastric pain: Secondary | ICD-10-CM

## 2015-06-05 DIAGNOSIS — K219 Gastro-esophageal reflux disease without esophagitis: Secondary | ICD-10-CM | POA: Diagnosis not present

## 2015-06-05 MED ORDER — SUCRALFATE 1 G PO TABS: 1.0000 g | ORAL_TABLET | Freq: Three times a day (TID) | ORAL | Status: DC

## 2015-06-05 MED ORDER — PANTOPRAZOLE SODIUM 40 MG PO TBEC: 40.0000 mg | DELAYED_RELEASE_TABLET | Freq: Every day | ORAL | Status: DC

## 2015-06-05 NOTE — Progress Notes (Signed)
   Subjective:    Patient ID: Benjamin Lowe, male    DOB: 1940-09-06, 75 y.o.   MRN: [id number]  HPI Patient is here today for a follow up visit on chest pain and epigastrium symptoms. Patient states that he feels a lot better.  Patient has states that he has no new concerns at this time.  Occasional regurgitation very rare to get any type epigastric pain denies fever chills or sweats. PMH benign Review of Systems  Constitutional: Negative for activity change, appetite change and fatigue.  HENT: Negative for congestion.   Respiratory: Negative for cough.   Cardiovascular: Negative for chest pain.  Gastrointestinal: Negative for nausea, vomiting, abdominal pain, diarrhea and constipation.  Endocrine: Negative for polydipsia and polyphagia.  Neurological: Negative for weakness.  Psychiatric/Behavioral: Negative for confusion.       Objective:   Physical Exam  Constitutional: He appears well-nourished. No distress.  Cardiovascular: Normal rate, regular rhythm and normal heart sounds.   No murmur heard. Pulmonary/Chest: Effort normal and breath sounds normal. No respiratory distress.  Abdominal: Soft. He exhibits no distension and no mass. There is no tenderness. There is no rebound and no guarding.  Musculoskeletal: He exhibits no edema.  Lymphadenopathy:    He has no cervical adenopathy.  Neurological: He is alert.  Psychiatric: His behavior is normal.  Vitals reviewed.         Assessment & Plan:  GERD with intermittent epigastric pain/reflux symptoms-doing well on protonix daily. Patient requests refill of Carafate he states it did help him I told him he would not need to use long-term refills given patient to minimize tomato based products patient follow-up in August for wellness checkup

## 2015-08-27 ENCOUNTER — Other Ambulatory Visit: Payer: Self-pay | Admitting: Family Medicine

## 2015-08-27 NOTE — Telephone Encounter (Signed)
May have this +5 additional refills 

## 2015-10-08 ENCOUNTER — Telehealth: Payer: Self-pay | Admitting: Family Medicine

## 2015-10-08 DIAGNOSIS — Z139 Encounter for screening, unspecified: Secondary | ICD-10-CM

## 2015-10-08 DIAGNOSIS — Z125 Encounter for screening for malignant neoplasm of prostate: Secondary | ICD-10-CM

## 2015-10-08 DIAGNOSIS — E785 Hyperlipidemia, unspecified: Secondary | ICD-10-CM

## 2015-10-08 NOTE — Telephone Encounter (Signed)
Pt would like bw orders, call when ready   Last labs  09/26/14 CBC, PSA, BMP, Hep, Lip

## 2015-10-08 NOTE — Telephone Encounter (Signed)
Pt.notified

## 2015-10-08 NOTE — Telephone Encounter (Signed)
Repeat same lab work as June 2016 thank you

## 2015-10-08 NOTE — Telephone Encounter (Signed)
Tried to call no answer

## 2015-10-11 LAB — LIPID PANEL
CHOL/HDL RATIO: 3.5 ratio (ref 0.0–5.0)
CHOLESTEROL TOTAL: 175 mg/dL (ref 100–199)
HDL: 50 mg/dL (ref >39)
LDL CALC: 96 mg/dL (ref 0–99)
TRIGLYCERIDES: 145 mg/dL (ref 0–149)
VLDL CHOLESTEROL CAL: 29 mg/dL (ref 5–40)

## 2015-10-11 LAB — CBC WITH DIFFERENTIAL/PLATELET
Basophils Absolute: 0 10*3/uL (ref 0.0–0.2)
Basos: 0 %
EOS (ABSOLUTE): 0.2 10*3/uL (ref 0.0–0.4)
Eos: 3 %
HEMATOCRIT: 48.8 % (ref 37.5–51.0)
HEMOGLOBIN: 15.9 g/dL (ref 12.6–17.7)
Immature Grans (Abs): 0 10*3/uL (ref 0.0–0.1)
Immature Granulocytes: 0 %
LYMPHS ABS: 1.6 10*3/uL (ref 0.7–3.1)
Lymphs: 33 %
MCH: 31.5 pg (ref 26.6–33.0)
MCHC: 32.6 g/dL (ref 31.5–35.7)
MCV: 97 fL (ref 79–97)
MONOCYTES: 12 %
Monocytes Absolute: 0.6 10*3/uL (ref 0.1–0.9)
NEUTROS ABS: 2.6 10*3/uL (ref 1.4–7.0)
Neutrophils: 52 %
Platelets: 195 10*3/uL (ref 150–379)
RBC: 5.05 x10E6/uL (ref 4.14–5.80)
RDW: 13.6 % (ref 12.3–15.4)
WBC: 5 10*3/uL (ref 3.4–10.8)

## 2015-10-11 LAB — BASIC METABOLIC PANEL
BUN/Creatinine Ratio: 18 (ref 10–24)
BUN: 18 mg/dL (ref 8–27)
CALCIUM: 9.2 mg/dL (ref 8.6–10.2)
CHLORIDE: 101 mmol/L (ref 96–106)
CO2: 26 mmol/L (ref 18–29)
Creatinine, Ser: 0.99 mg/dL (ref 0.76–1.27)
GFR calc Af Amer: 86 mL/min/{1.73_m2} (ref >59)
GFR calc non Af Amer: 74 mL/min/{1.73_m2} (ref >59)
Glucose: 84 mg/dL (ref 65–99)
POTASSIUM: 4.6 mmol/L (ref 3.5–5.2)
Sodium: 139 mmol/L (ref 134–144)

## 2015-10-11 LAB — PSA: PROSTATE SPECIFIC AG, SERUM: 0.4 ng/mL (ref 0.0–4.0)

## 2015-10-11 LAB — HEPATIC FUNCTION PANEL
ALT: 14 IU/L (ref 0–44)
AST: 17 IU/L (ref 0–40)
Albumin: 4.1 g/dL (ref 3.5–4.8)
Alkaline Phosphatase: 76 IU/L (ref 39–117)
Bilirubin Total: 0.7 mg/dL (ref 0.0–1.2)
Bilirubin, Direct: 0.16 mg/dL (ref 0.00–0.40)
Total Protein: 7.2 g/dL (ref 6.0–8.5)

## 2015-10-12 ENCOUNTER — Encounter: Payer: Self-pay | Admitting: Family Medicine

## 2015-11-05 ENCOUNTER — Encounter: Payer: Medicare HMO | Admitting: Family Medicine

## 2015-11-10 ENCOUNTER — Other Ambulatory Visit: Payer: Self-pay | Admitting: Family Medicine

## 2015-11-10 ENCOUNTER — Ambulatory Visit (INDEPENDENT_AMBULATORY_CARE_PROVIDER_SITE_OTHER): Payer: Medicare HMO | Admitting: Family Medicine

## 2015-11-10 ENCOUNTER — Encounter: Payer: Self-pay | Admitting: Family Medicine

## 2015-11-10 VITALS — BP 138/80 | Ht 68.5 in | Wt 187.0 lb

## 2015-11-10 DIAGNOSIS — Z139 Encounter for screening, unspecified: Secondary | ICD-10-CM | POA: Diagnosis not present

## 2015-11-10 DIAGNOSIS — Z0000 Encounter for general adult medical examination without abnormal findings: Secondary | ICD-10-CM | POA: Diagnosis not present

## 2015-11-10 NOTE — Progress Notes (Signed)
   Subjective:    Patient ID: Benjamin Lowe, male    DOB: 11-Oct-1940, 75 y.o.   MRN: [id number]  HPI AWV- Annual Wellness Visit  The patient was seen for their annual wellness visit. The patient's past medical history, surgical history, and family history were reviewed. Pertinent vaccines were reviewed ( tetanus, pneumonia, shingles, flu) The patient's medication list was reviewed and updated.  The height and weight were entered. The patient's current BMI is: 28.02  Cognitive screening was completed. Outcome of Mini - Cog: pass  Falls within the past 6 months: none  Current tobacco usage: none (All patients who use tobacco were given written and verbal information on quitting)  Recent listing of emergency department/hospitalizations over the past year were reviewed.  current specialist the patient sees on a regular basis: none   Medicare annual wellness visit patient questionnaire was reviewed.  A written screening schedule for the patient for the next 5-10 years was given. Appropriate discussion of followup regarding next visit was discussed.  Concerns about left knee pain. Takes aleve about twice a week. Pain started about 3 months ago. He states for the most part is doing fairly well intermittent knee pain      Review of Systems  Constitutional: Negative for activity change, appetite change and fever.  HENT: Negative for congestion and rhinorrhea.   Eyes: Negative for discharge.  Respiratory: Negative for cough and wheezing.   Cardiovascular: Negative for chest pain.  Gastrointestinal: Negative for abdominal pain, blood in stool and vomiting.  Genitourinary: Negative for difficulty urinating and frequency.  Musculoskeletal: Positive for arthralgias. Negative for neck pain.  Skin: Negative for rash.  Allergic/Immunologic: Negative for environmental allergies and food allergies.  Neurological: Negative for weakness and headaches.  Psychiatric/Behavioral: Negative for  agitation.       Objective:   Physical Exam  Constitutional: He appears well-developed and well-nourished.  HENT:  Head: Normocephalic and atraumatic.  Right Ear: External ear normal.  Left Ear: External ear normal.  Nose: Nose normal.  Mouth/Throat: Oropharynx is clear and moist.  Eyes: EOM are normal. Pupils are equal, round, and reactive to light.  Neck: Normal range of motion. Neck supple. No thyromegaly present.  Cardiovascular: Normal rate, regular rhythm and normal heart sounds.   No murmur heard. Pulmonary/Chest: Effort normal and breath sounds normal. No respiratory distress. He has no wheezes.  Abdominal: Soft. Bowel sounds are normal. He exhibits no distension and no mass. There is no tenderness.  Genitourinary: Penis normal.  Musculoskeletal: Normal range of motion. He exhibits no edema.  Lymphadenopathy:    He has no cervical adenopathy.  Neurological: He is alert. He exhibits normal muscle tone.  Skin: Skin is warm and dry. No erythema.  Psychiatric: He has a normal mood and affect. His behavior is normal. Judgment normal.    Recent lab work reviewed with patient additional labs ordered      Assessment & Plan:  Adult wellness-complete.wellness physical was conducted today. Importance of diet and exercise were discussed in detail. In addition to this a discussion regarding safety was also covered. We also reviewed over immunizations and gave recommendations regarding current immunization needed for age. In addition to this additional areas were also touched on including: Preventative health exams needed: Colonoscopy patient refuses colonoscopy F bot  ordered  Patient was advised yearly wellness eFollow-up 6 months for generalized anxietyxam

## 2015-11-11 LAB — IFOBT (OCCULT BLOOD): IFOBT: NEGATIVE

## 2015-11-14 ENCOUNTER — Telehealth: Payer: Self-pay | Admitting: Family Medicine

## 2015-11-14 ENCOUNTER — Other Ambulatory Visit: Payer: Self-pay | Admitting: Family Medicine

## 2015-11-14 MED ORDER — SILDENAFIL CITRATE 100 MG PO TABS: ORAL_TABLET | ORAL | 6 refills | Status: DC

## 2015-11-14 NOTE — Telephone Encounter (Signed)
He may have 6 refills

## 2015-11-14 NOTE — Telephone Encounter (Signed)
Med sent to pharmacy. Busy signal x 3

## 2015-11-14 NOTE — Telephone Encounter (Signed)
Pt is requesting refills on his sildenafil (VIAGRA) 100 MG tablet.  [facility] [place]

## 2016-01-19 ENCOUNTER — Ambulatory Visit (INDEPENDENT_AMBULATORY_CARE_PROVIDER_SITE_OTHER): Payer: Medicare HMO | Admitting: Family Medicine

## 2016-01-19 ENCOUNTER — Encounter: Payer: Self-pay | Admitting: Family Medicine

## 2016-01-19 ENCOUNTER — Telehealth: Payer: Self-pay | Admitting: Family Medicine

## 2016-01-19 VITALS — BP 138/86 | Temp 98.8°F | Ht 68.5 in | Wt 182.4 lb

## 2016-01-19 DIAGNOSIS — R1013 Epigastric pain: Secondary | ICD-10-CM

## 2016-01-19 MED ORDER — SUCRALFATE 1 G PO TABS: 1.0000 g | ORAL_TABLET | Freq: Three times a day (TID) | ORAL | 5 refills | Status: DC

## 2016-01-19 MED ORDER — PANTOPRAZOLE SODIUM 40 MG PO TBEC: 40.0000 mg | DELAYED_RELEASE_TABLET | Freq: Every day | ORAL | 5 refills | Status: DC

## 2016-01-19 NOTE — Progress Notes (Signed)
   Subjective:    Patient ID: Benjamin Lowe, male    DOB: 1941/02/10, 75 y.o.   MRN: [id number]  Abdominal Pain  This is a recurrent problem. The current episode started more than 1 month ago. The pain is located in the epigastric region. Associated symptoms include belching. The pain is aggravated by belching. Treatments tried: Carafate, Protonix.  The patient relates a lot of belching gas in his abdomen. States belches up a lot. He isn't sure what is causing that. He denies any chest tightness pressure pain shortness breath rectal bleeding.  Patient states no other concerns this visit.   Review of Systems  Gastrointestinal: Positive for abdominal pain.       Objective:   Physical Exam  Lungs clear hearts regular abdomen soft no guarding rebound or tenderness. I talked with the patient at length I feel that some of the symptoms are related to anxiety and also depression related symptoms. The patient is less then desiring to be on any type of medicine currently he is can be bringing a friend back next week to discuss this greater      Assessment & Plan:  I find no evidence of cancer tumor I find no evidence of any severe disease at rectal that needs any type of testing we will see him back next week and discuss further how stress may play a role in this

## 2016-01-19 NOTE — Telephone Encounter (Signed)
Notified patient that refills were sent to pharmacy.

## 2016-01-19 NOTE — Telephone Encounter (Signed)
6 months refill of each

## 2016-01-19 NOTE — Telephone Encounter (Signed)
Patient requesting Rx for pantoprazole and sucralfate.   [facility] [place]

## 2016-01-22 ENCOUNTER — Ambulatory Visit: Payer: Medicare HMO | Admitting: Family Medicine

## 2016-01-26 ENCOUNTER — Encounter: Payer: Self-pay | Admitting: Family Medicine

## 2016-01-26 ENCOUNTER — Ambulatory Visit (INDEPENDENT_AMBULATORY_CARE_PROVIDER_SITE_OTHER): Payer: Medicare HMO | Admitting: Family Medicine

## 2016-01-26 VITALS — BP 140/90 | Ht 68.5 in | Wt 192.4 lb

## 2016-01-26 DIAGNOSIS — F411 Generalized anxiety disorder: Secondary | ICD-10-CM

## 2016-01-26 DIAGNOSIS — R1013 Epigastric pain: Secondary | ICD-10-CM

## 2016-01-26 MED ORDER — SERTRALINE HCL 50 MG PO TABS: 50.0000 mg | ORAL_TABLET | Freq: Every day | ORAL | 3 refills | Status: DC

## 2016-01-26 NOTE — Progress Notes (Signed)
   Subjective:    Patient ID: Benjamin Lowe, male    DOB: 07/08/1940, 75 y.o.   MRN: [id number]  HPI Patient arrives to follow up on dyspepsia. Patient stated the carafate and protonix has helped. This patient finds himself worried a lot. He gets anxious a lot. It affects his day-to-day living. His fiance is here with him. He is scared about taking any new medicines but at the same time realizes that his excessive worry causes significant problems with how he functions. He denies being depressed.  Review of Systems  Constitutional: Negative for fatigue and fever.  HENT: Negative for congestion.   Respiratory: Negative for cough.   Cardiovascular: Negative for chest pain.  Gastrointestinal: Positive for nausea. Negative for abdominal pain.  Musculoskeletal: Negative for arthralgias.       Objective:   Physical Exam  Constitutional: He appears well-nourished. No distress.  Cardiovascular: Normal rate, regular rhythm and normal heart sounds.   No murmur heard. Pulmonary/Chest: Effort normal and breath sounds normal. No respiratory distress.  Musculoskeletal: He exhibits no edema.  Lymphadenopathy:    He has no cervical adenopathy.  Neurological: He is alert.  Psychiatric: His behavior is normal.  Vitals reviewed.         Assessment & Plan:  Generalized anxiety disorder-Xanax when necessary, structured living, consideration for medication discussed in detail he agrees to try medicine. I told him if he has side effects with the medicine immediately stop it and let us know. If he starts finding himself getting depressed or sad follow-up immediately. Recommend recheck in 3 months sooner if any problems.  Dyspepsia continue current measures  Follow-up 3 months

## 2016-01-30 ENCOUNTER — Telehealth: Payer: Self-pay | Admitting: Family Medicine

## 2016-01-30 NOTE — Telephone Encounter (Signed)
Patient started his Zoloft on Wednesday morning and he is having issues with it and wanting to stop taking it.  He is having difficulty eating and he is really shaky.  Please advise.  Melissa (his fiance) called to let us know, but she would like for [place] to call CE back.

## 2016-01-30 NOTE — Telephone Encounter (Signed)
Left message return call (Dr. Nicki Reaper said to stop medication and patient should feel much better in a few days)

## 2016-02-02 ENCOUNTER — Telehealth: Payer: Self-pay | Admitting: Family Medicine

## 2016-02-02 NOTE — Telephone Encounter (Signed)
Pt is wanting to know if he should continue to take sucralfate (CARAFATE) 1 g tablet Please advise.

## 2016-02-02 NOTE — Telephone Encounter (Signed)
Notified patient if he is doing better he can go ahead and stop this particular medicine for now. Patient verbalized understanding.

## 2016-02-02 NOTE — Telephone Encounter (Signed)
If he is doing better he can go ahead and stop this particular medicine for now

## 2016-02-02 NOTE — Telephone Encounter (Signed)
Notified patient Dr. Nicki Reaper said to stop medication and patient should feel much better in a few days. Patient verbalized understanding.

## 2016-02-23 ENCOUNTER — Telehealth: Payer: Self-pay | Admitting: Family Medicine

## 2016-02-23 NOTE — Telephone Encounter (Signed)
Discussed with pt. Pt verbalized understanding.  °

## 2016-02-23 NOTE — Telephone Encounter (Signed)
He may use the Carafate for the next 3 weeks then after that he should try to just stick with the other medicine. If he should have a flareup in the future he may use the Carafate for several weeks

## 2016-02-23 NOTE — Telephone Encounter (Signed)
Patient says he is experiencing the gas in his chest again and wants to know if he should go ahead and take the Rx for sucralfate that was prescribed?

## 2016-02-23 NOTE — Telephone Encounter (Signed)
Pt states he ate some pork with barbecue sauce on Saturday and since then having gas and stomach swelling, no chest pain. Takes protonix every day. States when this happened last time he took carafate for 2 -3 months and it helped. Has not taken in one month has refills wants to know if he should take.

## 2016-04-01 ENCOUNTER — Encounter: Payer: Self-pay | Admitting: Family Medicine

## 2016-04-01 ENCOUNTER — Ambulatory Visit (INDEPENDENT_AMBULATORY_CARE_PROVIDER_SITE_OTHER): Payer: Medicare HMO | Admitting: Family Medicine

## 2016-04-01 VITALS — BP 136/86 | Temp 98.1°F | Ht 68.5 in | Wt 189.2 lb

## 2016-04-01 DIAGNOSIS — J019 Acute sinusitis, unspecified: Secondary | ICD-10-CM | POA: Diagnosis not present

## 2016-04-01 DIAGNOSIS — B9689 Other specified bacterial agents as the cause of diseases classified elsewhere: Secondary | ICD-10-CM

## 2016-04-01 MED ORDER — AMOXICILLIN 500 MG PO TABS: 500.0000 mg | ORAL_TABLET | Freq: Three times a day (TID) | ORAL | 0 refills | Status: DC

## 2016-04-01 NOTE — Progress Notes (Signed)
   Subjective:    Patient ID: Benjamin Lowe, male    DOB: Nov 03, 1940, 76 y.o.   MRN: [id number]  Sinus Problem  This is a new problem. The current episode started in the past 7 days. Associated symptoms include congestion, coughing and a sore throat. Pertinent negatives include no ear pain.   Viral like illness head congestion draiage no wheezing no dificulty reathing no vomting   Review of Systems  Constitutional: Negative for activity change and fever.  HENT: Positive for congestion, rhinorrhea and sore throat. Negative for ear pain.   Eyes: Negative for discharge.  Respiratory: Positive for cough. Negative for wheezing.   Cardiovascular: Negative for chest pain.       Objective:   Physical Exam  Constitutional: He appears well-developed.  HENT:  Head: Normocephalic.  Mouth/Throat: Oropharynx is clear and moist. No oropharyngeal exudate.  Neck: Normal range of motion.  Cardiovascular: Normal rate, regular rhythm and normal heart sounds.   No murmur heard. Pulmonary/Chest: Effort normal and breath sounds normal. He has no wheezes.  Lymphadenopathy:    He has no cervical adenopathy.  Neurological: He exhibits normal muscle tone.  Skin: Skin is warm and dry.  Nursing note and vitals reviewed.         Assessment & Plan:  Viral illness Acute rhinosinusitis frontal infection Supportive measures discussed follow-up if problems

## 2016-04-08 ENCOUNTER — Telehealth: Payer: Self-pay | Admitting: Family Medicine

## 2016-04-08 MED ORDER — CEFPROZIL 250 MG PO TABS: 250.0000 mg | ORAL_TABLET | Freq: Two times a day (BID) | ORAL | 0 refills | Status: AC

## 2016-04-08 NOTE — Telephone Encounter (Signed)
Sometimes this can take a while for it to get better. I recommend humidifier saline nasal spray frequently also Cefzil 250 mg 1 twice a day for the next 10 days if not well over the next 2 weeks follow-up sooner problems

## 2016-04-08 NOTE — Telephone Encounter (Signed)
I spoke with patient. I advised of recommendations and medication changes. He voiced understanding and agreed with plan.

## 2016-04-08 NOTE — Telephone Encounter (Signed)
Patient calling for another antibiotic to be called in still sick with congestion was seen 04/01/16. [facility] [place]

## 2016-04-08 NOTE — Telephone Encounter (Signed)
Patient states he still has nasal congestion, productive cough-yellow mucus, denies fever.  He c/o sinus pressure and "clogged ears".

## 2016-04-27 ENCOUNTER — Telehealth: Payer: Self-pay | Admitting: Family Medicine

## 2016-04-27 ENCOUNTER — Other Ambulatory Visit: Payer: Self-pay | Admitting: Family Medicine

## 2016-04-27 NOTE — Telephone Encounter (Signed)
Patient requested refill on his protonix. Called [facility] on behalf of patient and told them to refill this medication.

## 2016-04-27 NOTE — Telephone Encounter (Signed)
Patient wants to know the name of the medication that he was prescribed to take 1 times daily in the morning.

## 2016-04-27 NOTE — Telephone Encounter (Signed)
Busy signal x3.

## 2016-04-28 ENCOUNTER — Telehealth: Payer: Self-pay | Admitting: Family Medicine

## 2016-04-28 NOTE — Telephone Encounter (Signed)
Refill faxed to pharm.  

## 2016-04-28 NOTE — Telephone Encounter (Signed)
Requesting Rx for xanax to [facility].

## 2016-04-28 NOTE — Telephone Encounter (Signed)
Pt may have this and 3 refills

## 2016-04-28 NOTE — Telephone Encounter (Signed)
Please see refill requests was just sent to my prescription request box

## 2016-04-28 NOTE — Telephone Encounter (Signed)
Pt.notified

## 2016-04-29 ENCOUNTER — Ambulatory Visit (INDEPENDENT_AMBULATORY_CARE_PROVIDER_SITE_OTHER): Payer: Medicare HMO | Admitting: Otolaryngology

## 2016-04-29 DIAGNOSIS — H903 Sensorineural hearing loss, bilateral: Secondary | ICD-10-CM

## 2016-04-29 DIAGNOSIS — H6123 Impacted cerumen, bilateral: Secondary | ICD-10-CM

## 2016-05-11 ENCOUNTER — Ambulatory Visit: Payer: Medicare HMO | Admitting: Family Medicine

## 2016-05-12 ENCOUNTER — Ambulatory Visit: Payer: Medicare HMO | Admitting: Family Medicine

## 2016-06-28 ENCOUNTER — Encounter: Payer: Self-pay | Admitting: Family Medicine

## 2016-06-28 ENCOUNTER — Ambulatory Visit (INDEPENDENT_AMBULATORY_CARE_PROVIDER_SITE_OTHER): Payer: Medicare HMO | Admitting: Family Medicine

## 2016-06-28 VITALS — BP 126/84 | Ht 68.5 in | Wt 183.0 lb

## 2016-06-28 DIAGNOSIS — F411 Generalized anxiety disorder: Secondary | ICD-10-CM

## 2016-06-28 DIAGNOSIS — R69 Illness, unspecified: Secondary | ICD-10-CM | POA: Diagnosis not present

## 2016-06-28 MED ORDER — ALPRAZOLAM 0.25 MG PO TABS: ORAL_TABLET | ORAL | 5 refills | Status: DC

## 2016-06-28 NOTE — Progress Notes (Signed)
   Subjective:    Patient ID: Benjamin Lowe, male    DOB: 1940/11/27, 76 y.o.   MRN: [id number]  Anxiety  Presents for follow-up visit.   Compliance with medications: takes xanax one half tablet at bedtime.   Left knee pain. Takes aleve. Pain started 3 years ago.    Review of Systems Denies chest tightness pressure pain shortness breath vomiting diarrhea denies depression does have some mild occasional anxiety issues and insomnia issues    Objective:   Physical Exam  Lungs clear heart regular pulse normal extremities no edema left knee good ligament function      Assessment & Plan:  Minimal reflux issues no PPI indicated currently patient monitors and avoids it by diet  Generalized anxiety uses Xanax occasionally cautioned on drowsiness  Insomnia issues patient was counseled that Xanax no more than 1-1/2 at night as necessary follow-up for wellness checkup in August  Not depressed currently

## 2016-07-08 DIAGNOSIS — R69 Illness, unspecified: Secondary | ICD-10-CM | POA: Diagnosis not present

## 2016-09-20 ENCOUNTER — Telehealth: Payer: Self-pay | Admitting: Family Medicine

## 2016-09-20 DIAGNOSIS — Z125 Encounter for screening for malignant neoplasm of prostate: Secondary | ICD-10-CM

## 2016-09-20 DIAGNOSIS — K219 Gastro-esophageal reflux disease without esophagitis: Secondary | ICD-10-CM

## 2016-09-20 DIAGNOSIS — E785 Hyperlipidemia, unspecified: Secondary | ICD-10-CM

## 2016-09-20 NOTE — Telephone Encounter (Signed)
Left message return call 09/20/16

## 2016-09-20 NOTE — Telephone Encounter (Signed)
Spoke with patient and informed him per Dr.Scott Luking- labs were  ordered. Patient verbalized understanding.

## 2016-09-20 NOTE — Telephone Encounter (Signed)
Patient has an appointment with Dr. Nicki Reaper on 11/18/16.  He is requesting order for labs.

## 2016-09-20 NOTE — Telephone Encounter (Signed)
PSA lipid met 7

## 2016-10-14 ENCOUNTER — Telehealth: Payer: Self-pay | Admitting: *Deleted

## 2016-10-14 NOTE — Telephone Encounter (Signed)
Patient is requesting a lab order so he can get his labs done before the appt.

## 2016-10-14 NOTE — Telephone Encounter (Signed)
Blood work was ordered in [facility] 09/20/16. Patient notified.

## 2016-10-29 DIAGNOSIS — E785 Hyperlipidemia, unspecified: Secondary | ICD-10-CM | POA: Diagnosis not present

## 2016-10-29 DIAGNOSIS — K219 Gastro-esophageal reflux disease without esophagitis: Secondary | ICD-10-CM | POA: Diagnosis not present

## 2016-10-29 DIAGNOSIS — Z125 Encounter for screening for malignant neoplasm of prostate: Secondary | ICD-10-CM | POA: Diagnosis not present

## 2016-10-30 LAB — BASIC METABOLIC PANEL
BUN / CREAT RATIO: 17 (ref 10–24)
BUN: 18 mg/dL (ref 8–27)
CHLORIDE: 102 mmol/L (ref 96–106)
CO2: 26 mmol/L (ref 20–29)
Calcium: 9.4 mg/dL (ref 8.6–10.2)
Creatinine, Ser: 1.05 mg/dL (ref 0.76–1.27)
GFR, EST AFRICAN AMERICAN: 79 mL/min/{1.73_m2} (ref >59)
GFR, EST NON AFRICAN AMERICAN: 68 mL/min/{1.73_m2} (ref >59)
Glucose: 87 mg/dL (ref 65–99)
POTASSIUM: 4.4 mmol/L (ref 3.5–5.2)
Sodium: 143 mmol/L (ref 134–144)

## 2016-10-30 LAB — LIPID PANEL
CHOL/HDL RATIO: 3.7 ratio (ref 0.0–5.0)
Cholesterol, Total: 184 mg/dL (ref 100–199)
HDL: 50 mg/dL (ref >39)
LDL Calculated: 108 mg/dL — ABNORMAL HIGH (ref 0–99)
TRIGLYCERIDES: 129 mg/dL (ref 0–149)
VLDL Cholesterol Cal: 26 mg/dL (ref 5–40)

## 2016-10-30 LAB — PSA: Prostate Specific Ag, Serum: 0.5 ng/mL (ref 0.0–4.0)

## 2016-11-17 ENCOUNTER — Encounter: Payer: Medicare HMO | Admitting: Family Medicine

## 2016-11-18 ENCOUNTER — Ambulatory Visit (INDEPENDENT_AMBULATORY_CARE_PROVIDER_SITE_OTHER): Payer: Medicare HMO | Admitting: Family Medicine

## 2016-11-18 ENCOUNTER — Encounter: Payer: Self-pay | Admitting: Family Medicine

## 2016-11-18 VITALS — BP 164/102 | Ht 68.5 in | Wt 186.0 lb

## 2016-11-18 DIAGNOSIS — Z1211 Encounter for screening for malignant neoplasm of colon: Secondary | ICD-10-CM | POA: Diagnosis not present

## 2016-11-18 DIAGNOSIS — Z0000 Encounter for general adult medical examination without abnormal findings: Secondary | ICD-10-CM

## 2016-11-18 NOTE — Progress Notes (Signed)
   Subjective:    Patient ID: Benjamin Lowe, male    DOB: 05/20/1940, 76 y.o.   MRN: [id number]  HPI AWV- Annual Wellness Visit  The patient was seen for their annual wellness visit. The patient's past medical history, surgical history, and family history were reviewed. Pertinent vaccines were reviewed ( tetanus, pneumonia, shingles, flu) The patient's medication list was reviewed and updated.  The height and weight were entered. The patient's current BMI is:27.87  Cognitive screening was completed. Outcome of Mini - Cog: Passed  Falls within the past 6 months:None  Current tobacco usage: Chews (All patients who use tobacco were given written and verbal information on quitting)  Recent listing of emergency department/hospitalizations over the past year were reviewed.  current specialist the patient sees on a regular basis:None   Medicare annual wellness visit patient questionnaire was reviewed.  A written screening schedule for the patient for the next 5-10 years was given. Appropriate discussion of followup regarding next visit was discussed.   He denies being depressed He denies misusing medicine States he tries the healthy   Review of Systems  Constitutional: Negative for activity change, appetite change and fever.  HENT: Negative for congestion and rhinorrhea.   Eyes: Negative for discharge.  Respiratory: Negative for cough and wheezing.   Cardiovascular: Negative for chest pain.  Gastrointestinal: Negative for abdominal pain, blood in stool and vomiting.  Genitourinary: Negative for difficulty urinating and frequency.  Musculoskeletal: Negative for neck pain.  Skin: Negative for rash.  Allergic/Immunologic: Negative for environmental allergies and food allergies.  Neurological: Negative for weakness and headaches.  Psychiatric/Behavioral: Negative for agitation.       Objective:   Physical Exam  Constitutional: He appears well-developed and well-nourished.    HENT:  Head: Normocephalic and atraumatic.  Right Ear: External ear normal.  Left Ear: External ear normal.  Nose: Nose normal.  Mouth/Throat: Oropharynx is clear and moist.  Eyes: Pupils are equal, round, and reactive to light. EOM are normal.  Neck: Normal range of motion. Neck supple. No thyromegaly present.  Cardiovascular: Normal rate, regular rhythm and normal heart sounds.   No murmur heard. Pulmonary/Chest: Effort normal and breath sounds normal. No respiratory distress. He has no wheezes.  Abdominal: Soft. Bowel sounds are normal. He exhibits no distension and no mass. There is no tenderness.  Genitourinary: Prostate normal and penis normal.  Musculoskeletal: Normal range of motion. He exhibits no edema.  Lymphadenopathy:    He has no cervical adenopathy.  Neurological: He is alert. He exhibits normal muscle tone.  Skin: Skin is warm and dry. No erythema.  Psychiatric: He has a normal mood and affect. His behavior is normal. Judgment normal.          Assessment & Plan:  Adult wellness-complete.wellness physical was conducted today. Importance of diet and exercise were discussed in detail. In addition to this a discussion regarding safety was also covered. We also reviewed over immunizations and gave recommendations regarding current immunization needed for age. In addition to this additional areas were also touched on including: Preventative health exams needed: Colonoscopy patient defers on colonoscopy he would like to have stool testing for blood  Patient was advised yearly wellness exam  Follow-up in the spring

## 2016-11-23 ENCOUNTER — Telehealth: Payer: Self-pay | Admitting: *Deleted

## 2016-11-23 ENCOUNTER — Other Ambulatory Visit (INDEPENDENT_AMBULATORY_CARE_PROVIDER_SITE_OTHER): Payer: Medicare HMO | Admitting: *Deleted

## 2016-11-23 DIAGNOSIS — Z0000 Encounter for general adult medical examination without abnormal findings: Secondary | ICD-10-CM

## 2016-11-23 DIAGNOSIS — Z1211 Encounter for screening for malignant neoplasm of colon: Secondary | ICD-10-CM | POA: Diagnosis not present

## 2016-11-23 DIAGNOSIS — K921 Melena: Secondary | ICD-10-CM

## 2016-11-23 LAB — IFOBT (OCCULT BLOOD): IFOBT: POSITIVE

## 2016-11-23 NOTE — Telephone Encounter (Signed)
hemosure test positive. See result note

## 2016-11-24 ENCOUNTER — Encounter: Payer: Self-pay | Admitting: Family Medicine

## 2016-11-24 ENCOUNTER — Encounter (INDEPENDENT_AMBULATORY_CARE_PROVIDER_SITE_OTHER): Payer: Self-pay | Admitting: Internal Medicine

## 2016-11-24 NOTE — Telephone Encounter (Signed)
Results discussed with patient. Patient advised his stool tests was positive. This will need further looking into by the gastroenterologist Go ahead with setting up referral for gastroenterology per Dr Nicki Reaper. Patient verbalized understanding. Referral ordered in [facility].

## 2016-11-24 NOTE — Telephone Encounter (Signed)
Left message to return call 

## 2016-11-24 NOTE — Telephone Encounter (Signed)
Per result note this patient will need gastroenterology referral.

## 2016-11-30 ENCOUNTER — Telehealth (INDEPENDENT_AMBULATORY_CARE_PROVIDER_SITE_OTHER): Payer: Self-pay | Admitting: *Deleted

## 2016-11-30 ENCOUNTER — Ambulatory Visit (INDEPENDENT_AMBULATORY_CARE_PROVIDER_SITE_OTHER): Payer: Medicare HMO | Admitting: Internal Medicine

## 2016-11-30 ENCOUNTER — Other Ambulatory Visit (INDEPENDENT_AMBULATORY_CARE_PROVIDER_SITE_OTHER): Payer: Self-pay | Admitting: Internal Medicine

## 2016-11-30 ENCOUNTER — Encounter (INDEPENDENT_AMBULATORY_CARE_PROVIDER_SITE_OTHER): Payer: Self-pay | Admitting: *Deleted

## 2016-11-30 ENCOUNTER — Encounter (INDEPENDENT_AMBULATORY_CARE_PROVIDER_SITE_OTHER): Payer: Self-pay | Admitting: Internal Medicine

## 2016-11-30 VITALS — BP 170/96 | HR 68 | Temp 98.0°F | Ht 69.0 in | Wt 186.8 lb

## 2016-11-30 DIAGNOSIS — Z800 Family history of malignant neoplasm of digestive organs: Secondary | ICD-10-CM | POA: Diagnosis not present

## 2016-11-30 DIAGNOSIS — R195 Other fecal abnormalities: Secondary | ICD-10-CM

## 2016-11-30 MED ORDER — PEG 3350-KCL-NA BICARB-NACL 420 G PO SOLR: 4000.0000 mL | Freq: Once | ORAL | 0 refills | Status: AC

## 2016-11-30 NOTE — Patient Instructions (Signed)
Colonoscopy. The risks of bleeding, perforation and infection were reviewed with patient.  

## 2016-11-30 NOTE — Progress Notes (Signed)
   Subjective:    Patient ID: Benjamin Lowe, male    DOB: [birth year], 76 y.o.   MRN: [id number]  HPI Referred by Dr. Wolfgang Phoenix for guaiac positive stool.  11/23/2016 Stool card positive.  Last colonoscopy in 2011 by Dr. Lucio Edward.  (Surveillance: Family hx of colon cancer in father with colon cancer at 31). Died of Parkinson's.  Coloscopy was normal.  In 2006 One polyp: no path.  Appetite is good. No weight loss. No change in his stools. BM x 1 a day.  Hx of GERD.  No change in his stools.  No NSAIDs.     Review of Systems No past medical history on file.  Past Surgical History:  Procedure Laterality Date  . COLONOSCOPY    . KNEE SURGERY      Allergies  Allergen Reactions  . Azithromycin     Vomiting, vertigo  . Zoloft [Sertraline Hcl]     Patient feels like he wants to hurt himself    Current Outpatient Prescriptions on File Prior to Visit  Medication Sig Dispense Refill  . ALPRAZolam (XANAX) 0.25 MG tablet TAKE ONE-HALF TO ONE TABLET BY MOUTH EVERY 8 HOURS AS NEEDED 60 tablet 5   No current facility-administered medications on file prior to visit.         Objective:   Physical Exam Blood pressure (!) 170/96, pulse 68, temperature 98 F (36.7 C), height 5\' 9"  (1.753 m), weight 186 lb 12.8 oz (84.7 kg). Alert and oriented. Skin warm and dry. Oral mucosa is moist.   . Sclera anicteric, conjunctivae is pink. Thyroid not enlarged. No cervical lymphadenopathy. Lungs clear. Heart regular rate and rhythm.  Abdomen is soft. Bowel sounds are positive. No hepatomegaly. No abdominal masses felt. No tenderness.  No edema to lower extremities.  Rectal: no mass. Guaiac negative.         Assessment & Plan:  Guaiac + stool. Family hx of colon cancer.   Colonic neoplasm needs to be ruled out.  Polyps, hemorrhoids also in the differential. Colonoscopy.

## 2016-11-30 NOTE — Telephone Encounter (Signed)
Patient needs trilyte 

## 2016-12-08 ENCOUNTER — Telehealth (INDEPENDENT_AMBULATORY_CARE_PROVIDER_SITE_OTHER): Payer: Self-pay | Admitting: *Deleted

## 2016-12-08 ENCOUNTER — Ambulatory Visit (INDEPENDENT_AMBULATORY_CARE_PROVIDER_SITE_OTHER): Payer: Medicare HMO | Admitting: Internal Medicine

## 2016-12-08 ENCOUNTER — Encounter (INDEPENDENT_AMBULATORY_CARE_PROVIDER_SITE_OTHER): Payer: Self-pay | Admitting: *Deleted

## 2016-12-08 MED ORDER — PEG 3350-KCL-NA BICARB-NACL 420 G PO SOLR: 4000.0000 mL | Freq: Once | ORAL | 0 refills | Status: AC

## 2016-12-08 NOTE — Telephone Encounter (Signed)
Patient needs trilyte 

## 2016-12-27 ENCOUNTER — Ambulatory Visit (HOSPITAL_COMMUNITY)
Admission: RE | Admit: 2016-12-27 | Discharge: 2016-12-27 | Disposition: A | Discharge status: Home / Self Care | Payer: Medicare HMO | Source: Ambulatory Visit | Attending: Internal Medicine | Admitting: Internal Medicine

## 2016-12-27 ENCOUNTER — Encounter (HOSPITAL_COMMUNITY)
Admission: RE | Disposition: A | Discharge status: Home / Self Care | Payer: Self-pay | Source: Ambulatory Visit | Attending: Internal Medicine

## 2016-12-27 ENCOUNTER — Encounter (HOSPITAL_COMMUNITY): Payer: Self-pay

## 2016-12-27 DIAGNOSIS — D122 Benign neoplasm of ascending colon: Secondary | ICD-10-CM | POA: Diagnosis not present

## 2016-12-27 DIAGNOSIS — Z833 Family history of diabetes mellitus: Secondary | ICD-10-CM | POA: Diagnosis not present

## 2016-12-27 DIAGNOSIS — K635 Polyp of colon: Secondary | ICD-10-CM | POA: Insufficient documentation

## 2016-12-27 DIAGNOSIS — K921 Melena: Secondary | ICD-10-CM | POA: Diagnosis not present

## 2016-12-27 DIAGNOSIS — Z79899 Other long term (current) drug therapy: Secondary | ICD-10-CM | POA: Diagnosis not present

## 2016-12-27 DIAGNOSIS — F329 Major depressive disorder, single episode, unspecified: Secondary | ICD-10-CM | POA: Insufficient documentation

## 2016-12-27 DIAGNOSIS — Z87891 Personal history of nicotine dependence: Secondary | ICD-10-CM | POA: Insufficient documentation

## 2016-12-27 DIAGNOSIS — R69 Illness, unspecified: Secondary | ICD-10-CM | POA: Diagnosis not present

## 2016-12-27 DIAGNOSIS — K644 Residual hemorrhoidal skin tags: Secondary | ICD-10-CM | POA: Insufficient documentation

## 2016-12-27 DIAGNOSIS — Z800 Family history of malignant neoplasm of digestive organs: Secondary | ICD-10-CM | POA: Diagnosis not present

## 2016-12-27 DIAGNOSIS — R195 Other fecal abnormalities: Secondary | ICD-10-CM | POA: Insufficient documentation

## 2016-12-27 DIAGNOSIS — K6289 Other specified diseases of anus and rectum: Secondary | ICD-10-CM | POA: Diagnosis not present

## 2016-12-27 DIAGNOSIS — Z888 Allergy status to other drugs, medicaments and biological substances status: Secondary | ICD-10-CM | POA: Insufficient documentation

## 2016-12-27 HISTORY — DX: Major depressive disorder, single episode, unspecified: F32.9

## 2016-12-27 HISTORY — PX: COLONOSCOPY: SHX5424

## 2016-12-27 HISTORY — DX: Depression, unspecified: F32.A

## 2016-12-27 SURGERY — COLONOSCOPY
Anesthesia: Moderate Sedation

## 2016-12-27 MED ORDER — MEPERIDINE HCL 50 MG/ML IJ SOLN
INTRAMUSCULAR | Status: DC | PRN
  Administered 2016-12-27 (×2): 25 mg via INTRAVENOUS

## 2016-12-27 MED ORDER — MEPERIDINE HCL 50 MG/ML IJ SOLN
INTRAMUSCULAR | Status: AC
  Filled 2016-12-27: qty 1

## 2016-12-27 MED ORDER — MIDAZOLAM HCL 5 MG/5ML IJ SOLN
INTRAMUSCULAR | Status: AC
  Filled 2016-12-27: qty 10

## 2016-12-27 MED ORDER — SODIUM CHLORIDE 0.9 % IV SOLN
INTRAVENOUS | Status: DC
  Administered 2016-12-27: 20 mL via INTRAVENOUS

## 2016-12-27 MED ORDER — MIDAZOLAM HCL 5 MG/5ML IJ SOLN
INTRAMUSCULAR | Status: DC | PRN
Start: 2016-12-27 — End: 2016-12-27
  Administered 2016-12-27 (×3): 2 mg via INTRAVENOUS

## 2016-12-27 NOTE — Op Note (Signed)
[facility] Patient Name: Benjamin Lowe Procedure Date: 12/27/2016 11:21 AM MRN: [id number] Date of Birth: Jun 14, 1940 Attending MD: Hildred Laser , MD CSN: [id number] Age: 76 Admit Type: Outpatient Procedure:                Colonoscopy Indications:              Heme positive stool Providers:                Hildred Laser, MD, Janeece Riggers, RN, Aram Candela Referring MD:             Elayne Snare. Wolfgang Phoenix, MD Medicines:                Meperidine 50 mg IV, Midazolam 6 mg IV Complications:            No immediate complications. Estimated Blood Loss:     Estimated blood loss was minimal. Procedure:                Pre-Anesthesia Assessment:                           - Prior to the procedure, a History and Physical                            was performed, and patient medications and                            allergies were reviewed. The patient's tolerance of                            previous anesthesia was also reviewed. The risks                            and benefits of the procedure and the sedation                            options and risks were discussed with the patient.                            All questions were answered, and informed consent                            was obtained. Prior Anticoagulants: The patient has                            taken no previous anticoagulant or antiplatelet                            agents. ASA Grade Assessment: I - A normal, healthy                            patient. After reviewing the risks and benefits,                            the patient was deemed in satisfactory condition to  undergo the procedure.                           After obtaining informed consent, the colonoscope                            was passed under direct vision. Throughout the                            procedure, the patient's blood pressure, pulse, and                            oxygen saturations were monitored continuously. The                             [id number] ([id number]) scope was introduced through                            the anus and advanced to the the terminal ileum,                            with identification of the appendiceal orifice and                            IC valve. The terminal ileum, ileocecal valve,                            appendiceal orifice, and rectum were photographed.                            The quality of the bowel preparation was excellent. Scope In: 1:28:03 PM Scope Out: 1:43:05 PM Scope Withdrawal Time: 0 hours 11 minutes 28 seconds  Total Procedure Duration: 0 hours 15 minutes 2 seconds  Findings:      The perianal and digital rectal examinations were normal.      A small polyp was found in the ascending colon. The polyp was sessile.       Biopsies were taken with a cold forceps for histology.      The exam was otherwise normal throughout the examined colon.      External hemorrhoids were found during retroflexion. The hemorrhoids       were small.      Anal papilla(e) were hypertrophied. Impression:               - One small polyp in the ascending colon. Biopsied.                           - External hemorrhoids.                           - Anal papilla(e) were hypertrophied. Moderate Sedation:      Moderate (conscious) sedation was administered by the endoscopy nurse       and supervised by the endoscopist. The following parameters were       monitored: oxygen saturation, heart rate, blood pressure, CO2       capnography and response to  care. Total physician intraservice time was       24 minutes. Recommendation:           - Patient has a contact number available for                            emergencies. The signs and symptoms of potential                            delayed complications were discussed with the                            patient. Return to normal activities tomorrow.                            Written discharge instructions were provided to the                             patient.                           - Resume previous diet today.                           - Continue present medications.                           - Await pathology results.                           - Repeat colonoscopy date to be determined after                            pending pathology results are reviewed. Procedure Code(s):        --- Professional ---                           [phone], Colonoscopy, flexible; with biopsy, single                            or multiple                           99152, Moderate sedation services provided by the                            same physician or other qualified health care                            professional performing the diagnostic or                            therapeutic service that the sedation supports,                            requiring the presence of an independent trained  observer to assist in the monitoring of the                            patient's level of consciousness and physiological                            status; initial 15 minutes of intraservice time,                            patient age 85 years or older                           [phone], Moderate sedation services; each additional                            15 minutes intraservice time Diagnosis Code(s):        --- Professional ---                           D12.2, Benign neoplasm of ascending colon                           K62.89, Other specified diseases of anus and rectum                           K64.4, Residual hemorrhoidal skin tags                           R19.5, Other fecal abnormalities CPT copyright 2016 American Medical Association. All rights reserved. The codes documented in this report are preliminary and upon coder review may  be revised to meet current compliance requirements. Hildred Laser, MD Hildred Laser, MD 12/27/2016 1:54:09 PM This report has been signed electronically. Number  of Addenda: 0

## 2016-12-27 NOTE — H&P (Signed)
Benjamin Lowe is an 76 y.o. male.   Chief Complaint: patient is here for colonoscopy. HPI: Asian is 75 year old Caucasian male who was found having positive stooland is here for diagnostic colonoscopy. He denies rectal bleeding or melena. His bowels are regular. He denies abdominal pain anorexia weight loss. Family history significant for CRC father who was 4 or 34 at the time of diagnosis anddied at 48 of Parkinson's disease.he had small poloved on his colonoscopy in 2006 but then polyp was lost.note father was found on colonoscopy of August 2011.  Past Medical History:  Diagnosis Date  . Depression    "when I get nervous"    Past Surgical History:  Procedure Laterality Date  . COLONOSCOPY    . KNEE SURGERY      Family History  Problem Relation Age of Onset  . Diabetes type II Mother   . Other Father   . Cancer - Colon Father   . Parkinsonism Father    Social History:  reports that he has quit smoking. He has never used smokeless tobacco. He reports that he does not drink alcohol or use drugs.  Allergies:  Allergies  Allergen Reactions  . Azithromycin Other (See Comments)    Zpack caused vomiting, vertigo  . Zoloft [Sertraline Hcl]     Patient feels like he wants to hurt himself    Medications Prior to Admission  Medication Sig Dispense Refill  . ALPRAZolam (XANAX) 0.25 MG tablet TAKE ONE-HALF TO ONE TABLET BY MOUTH EVERY 8 HOURS AS NEEDED (Patient taking differently: Take 0.25 mg by mouth 2 (two) times daily as needed for anxiety. ) 60 tablet 5  . naproxen sodium (ANAPROX) 220 MG tablet Take 110 mg by mouth daily as needed (headache, mild pain).    . pantoprazole (PROTONIX) 40 MG tablet Take 40 mg by mouth daily before breakfast.    . simethicone (MYLICON) 321 MG chewable tablet Chew 125 mg by mouth daily as needed for flatulence (gas).      No results found for this or any previous visit (from the past 48 hour(s)). No results found.  ROS  Blood pressure (!)  162/93, pulse 94, temperature 98.1 F (36.7 C), temperature source Oral, resp. rate 13, SpO2 100 %. Physical Exam  Constitutional: He appears well-developed and well-nourished.  HENT:  Mouth/Throat: Oropharynx is clear and moist.  Eyes: Conjunctivae are normal.  Neck: No thyromegaly present.  Cardiovascular: Normal rate, regular rhythm and normal heart sounds.   No murmur heard. Respiratory: Effort normal and breath sounds normal.  GI: Soft. He exhibits no distension and no mass. There is no tenderness.  Musculoskeletal: He exhibits no edema.  Lymphadenopathy:    He has no cervical adenopathy.  Neurological: He is alert.  Skin: Skin is warm and dry.     Assessment/Plan Heme-positive stool. Diagnostic colonoscopy.  Hildred Laser, MD 12/27/2016, 1:14 PM

## 2016-12-27 NOTE — Discharge Instructions (Signed)
Resume usual diet and medications. No driving for 24 hours. Physician will call with biopsy results.       Colonoscopy, Adult, Care After This sheet gives you information about how to care for yourself after your procedure. Your doctor may also give you more specific instructions. If you have problems or questions, call your doctor. Follow these instructions at home: General instructions   For the first 24 hours after the procedure: ? Do not drive or use machinery. ? Do not sign important documents. ? Do not drink alcohol. ? Do your daily activities more slowly than normal. ? Eat foods that are soft and easy to digest. ? Rest often.  Take over-the-counter or prescription medicines only as told by your doctor.  It is up to you to get the results of your procedure. Ask your doctor, or the department performing the procedure, when your results will be ready. To help cramping and bloating:  Try walking around.  Put heat on your belly (abdomen) as told by your doctor. Use a heat source that your doctor recommends, such as a moist heat pack or a heating pad. ? Put a towel between your skin and the heat source. ? Leave the heat on for 20-30 minutes. ? Remove the heat if your skin turns bright red. This is especially important if you cannot feel pain, heat, or cold. You can get burned. Eating and drinking  Drink enough fluid to keep your pee (urine) clear or pale yellow.  Return to your normal diet as told by your doctor. Avoid heavy or fried foods that are hard to digest.  Avoid drinking alcohol for as long as told by your doctor. Contact a doctor if:  You have blood in your poop (stool) 2-3 days after the procedure. Get help right away if:  You have more than a small amount of blood in your poop.  You see large clumps of tissue (blood clots) in your poop.  Your belly is swollen.  You feel sick to your stomach (nauseous).  You throw up (vomit).  You have a  fever.  You have belly pain that gets worse, and medicine does not help your pain. This information is not intended to replace advice given to you by your health care provider. Make sure you discuss any questions you have with your health care provider. Document Released: 04/17/2010 Document Revised: 12/08/2015 Document Reviewed: 12/08/2015 Elsevier Interactive Patient Education  2017 [place].     Colon Polyps Polyps are tissue growths inside the body. Polyps can grow in many places, including the large intestine (colon). A polyp may be a round bump or a mushroom-shaped growth. You could have one polyp or several. Most colon polyps are noncancerous (benign). However, some colon polyps can become cancerous over time. What are the causes? The exact cause of colon polyps is not known. What increases the risk? This condition is more likely to develop in people who:  Have a family history of colon cancer or colon polyps.  Are older than 61 or older than 45 if they are African American.  Have inflammatory bowel disease, such as ulcerative colitis or Crohn disease.  Are overweight.  Smoke cigarettes.  Do not get enough exercise.  Drink too much alcohol.  Eat a diet that is: ? High in fat and red meat. ? Low in fiber.  Had childhood cancer that was treated with abdominal radiation.  What are the signs or symptoms? Most polyps do not cause symptoms. If you  have symptoms, they may include:  Blood coming from your rectum when having a bowel movement.  Blood in your stool.The stool may look dark red or black.  A change in bowel habits, such as constipation or diarrhea.  How is this diagnosed? This condition is diagnosed with a colonoscopy. This is a procedure that uses a lighted, flexible scope to look at the inside of your colon. How is this treated? Treatment for this condition involves removing any polyps that are found. Those polyps will then be tested for cancer. If  cancer is found, your health care provider will talk to you about options for colon cancer treatment. Follow these instructions at home: Diet  Eat plenty of fiber, such as fruits, vegetables, and whole grains.  Eat foods that are high in calcium and vitamin D, such as milk, cheese, yogurt, eggs, liver, fish, and broccoli.  Limit foods high in fat, red meats, and processed meats, such as hot dogs, sausage, bacon, and lunch meats.  Maintain a healthy weight, or lose weight if recommended by your health care provider. General instructions  Do not smoke cigarettes.  Do not drink alcohol excessively.  Keep all follow-up visits as told by your health care provider. This is important. This includes keeping regularly scheduled colonoscopies. Talk to your health care provider about when you need a colonoscopy.  Exercise every day or as told by your health care provider. Contact a health care provider if:  You have new or worsening bleeding during a bowel movement.  You have new or increased blood in your stool.  You have a change in bowel habits.  You unexpectedly lose weight. This information is not intended to replace advice given to you by your health care provider. Make sure you discuss any questions you have with your health care provider. Document Released: 12/10/2003 Document Revised: 08/21/2015 Document Reviewed: 02/03/2015 Elsevier Interactive Patient Education  [employer].

## 2016-12-29 ENCOUNTER — Encounter (HOSPITAL_COMMUNITY): Payer: Self-pay | Admitting: Internal Medicine

## 2016-12-30 DIAGNOSIS — Z23 Encounter for immunization: Secondary | ICD-10-CM | POA: Diagnosis not present

## 2017-01-31 ENCOUNTER — Other Ambulatory Visit: Payer: Self-pay | Admitting: Family Medicine

## 2017-01-31 NOTE — Telephone Encounter (Signed)
May have this +5 refill

## 2017-02-02 DIAGNOSIS — R69 Illness, unspecified: Secondary | ICD-10-CM | POA: Diagnosis not present

## 2017-05-03 ENCOUNTER — Other Ambulatory Visit: Payer: Self-pay | Admitting: Family Medicine

## 2017-06-06 ENCOUNTER — Encounter: Payer: Self-pay | Admitting: Family Medicine

## 2017-06-06 ENCOUNTER — Ambulatory Visit (INDEPENDENT_AMBULATORY_CARE_PROVIDER_SITE_OTHER): Payer: Medicare HMO | Admitting: Family Medicine

## 2017-06-06 VITALS — BP 138/78 | Ht 69.0 in | Wt 187.4 lb

## 2017-06-06 DIAGNOSIS — E785 Hyperlipidemia, unspecified: Secondary | ICD-10-CM | POA: Diagnosis not present

## 2017-06-06 DIAGNOSIS — Z125 Encounter for screening for malignant neoplasm of prostate: Secondary | ICD-10-CM

## 2017-06-06 DIAGNOSIS — Z79899 Other long term (current) drug therapy: Secondary | ICD-10-CM

## 2017-06-06 DIAGNOSIS — F411 Generalized anxiety disorder: Secondary | ICD-10-CM | POA: Diagnosis not present

## 2017-06-06 DIAGNOSIS — R69 Illness, unspecified: Secondary | ICD-10-CM | POA: Diagnosis not present

## 2017-06-06 MED ORDER — ALPRAZOLAM 0.25 MG PO TABS: ORAL_TABLET | ORAL | 5 refills | Status: DC

## 2017-06-06 MED ORDER — PANTOPRAZOLE SODIUM 40 MG PO TBEC: 40.0000 mg | DELAYED_RELEASE_TABLET | Freq: Every day | ORAL | 5 refills | Status: DC

## 2017-06-06 NOTE — Progress Notes (Signed)
   Subjective:    Patient ID: Benjamin Lowe, male    DOB: 08-Oct-1940, 77 y.o.   MRN: [id number]  HPI  Patient arrives follow up on anxiety.  He states he does try to eat healthy he does have intermittent anxiety takes medications sporadically States reflux issues under good control uses medicine as needed basis Denies being depressed  Review of Systems  Constitutional: Negative for activity change, fatigue and fever.  HENT: Negative for congestion and rhinorrhea.   Respiratory: Negative for cough and shortness of breath.   Cardiovascular: Negative for chest pain and leg swelling.  Gastrointestinal: Negative for abdominal pain, diarrhea and nausea.  Genitourinary: Negative for dysuria and hematuria.  Neurological: Negative for weakness and headaches.  Psychiatric/Behavioral: Negative for behavioral problems.       Objective:   Physical Exam  Constitutional: He appears well-nourished. No distress.  HENT:  Head: Normocephalic and atraumatic.  Eyes: Right eye exhibits no discharge. Left eye exhibits no discharge.  Neck: No tracheal deviation present.  Cardiovascular: Normal rate, regular rhythm and normal heart sounds.  No murmur heard. Pulmonary/Chest: Effort normal and breath sounds normal. No respiratory distress. He has no wheezes.  Musculoskeletal: He exhibits no edema.  Lymphadenopathy:    He has no cervical adenopathy.  Neurological: He is alert.  Skin: Skin is warm. No rash noted.  Psychiatric: His behavior is normal.  Vitals reviewed.         Assessment & Plan:  1. Generalized anxiety disorder Denies being depressed uses his medication intermittently denies abusing it  2. Hyperlipidemia, unspecified hyperlipidemia type Watch his diet fairly closely is not on any medications - Lipid panel  3. High risk medication use Because of medications check lab work - Hepatic function panel - Basic metabolic panel  4. Screening PSA (prostate specific antigen) Check  PSA - PSA He is to do this lab work before his wellness exam in August

## 2017-07-05 DIAGNOSIS — R69 Illness, unspecified: Secondary | ICD-10-CM | POA: Diagnosis not present

## 2017-08-17 ENCOUNTER — Other Ambulatory Visit: Payer: Self-pay | Admitting: Family Medicine

## 2017-09-12 ENCOUNTER — Other Ambulatory Visit: Payer: Self-pay | Admitting: Family Medicine

## 2017-09-12 NOTE — Telephone Encounter (Signed)
May have this and 5 refills

## 2017-09-22 DIAGNOSIS — Z1283 Encounter for screening for malignant neoplasm of skin: Secondary | ICD-10-CM | POA: Diagnosis not present

## 2017-09-22 DIAGNOSIS — C44519 Basal cell carcinoma of skin of other part of trunk: Secondary | ICD-10-CM | POA: Diagnosis not present

## 2017-11-01 ENCOUNTER — Telehealth: Payer: Self-pay | Admitting: Family Medicine

## 2017-11-01 NOTE — Telephone Encounter (Signed)
Patient is requesting that once his lab work is resulted from this week, he would like mailed.

## 2017-11-01 NOTE — Telephone Encounter (Signed)
Pt is going to do the labs that were ordered in march tomorrow. He would like the results mailed to him

## 2017-11-03 DIAGNOSIS — Z125 Encounter for screening for malignant neoplasm of prostate: Secondary | ICD-10-CM | POA: Diagnosis not present

## 2017-11-03 DIAGNOSIS — Z79899 Other long term (current) drug therapy: Secondary | ICD-10-CM | POA: Diagnosis not present

## 2017-11-03 DIAGNOSIS — E785 Hyperlipidemia, unspecified: Secondary | ICD-10-CM | POA: Diagnosis not present

## 2017-11-04 ENCOUNTER — Encounter: Payer: Self-pay | Admitting: Family Medicine

## 2017-11-04 LAB — LIPID PANEL
Chol/HDL Ratio: 3.8 ratio (ref 0.0–5.0)
Cholesterol, Total: 162 mg/dL (ref 100–199)
HDL: 43 mg/dL (ref >39)
LDL Calculated: 93 mg/dL (ref 0–99)
Triglycerides: 128 mg/dL (ref 0–149)
VLDL Cholesterol Cal: 26 mg/dL (ref 5–40)

## 2017-11-04 LAB — BASIC METABOLIC PANEL
BUN / CREAT RATIO: 15 (ref 10–24)
BUN: 16 mg/dL (ref 8–27)
CHLORIDE: 102 mmol/L (ref 96–106)
CO2: 26 mmol/L (ref 20–29)
Calcium: 9.6 mg/dL (ref 8.6–10.2)
Creatinine, Ser: 1.04 mg/dL (ref 0.76–1.27)
GFR calc non Af Amer: 68 mL/min/{1.73_m2} (ref >59)
GFR, EST AFRICAN AMERICAN: 79 mL/min/{1.73_m2} (ref >59)
Glucose: 86 mg/dL (ref 65–99)
POTASSIUM: 4.1 mmol/L (ref 3.5–5.2)
Sodium: 142 mmol/L (ref 134–144)

## 2017-11-04 LAB — HEPATIC FUNCTION PANEL
ALK PHOS: 86 IU/L (ref 39–117)
ALT: 11 IU/L (ref 0–44)
AST: 14 IU/L (ref 0–40)
Albumin: 4.2 g/dL (ref 3.5–4.8)
BILIRUBIN, DIRECT: 0.15 mg/dL (ref 0.00–0.40)
Bilirubin Total: 0.7 mg/dL (ref 0.0–1.2)
Total Protein: 7 g/dL (ref 6.0–8.5)

## 2017-11-04 LAB — PSA: PROSTATE SPECIFIC AG, SERUM: 0.5 ng/mL (ref 0.0–4.0)

## 2017-11-21 ENCOUNTER — Encounter: Payer: Self-pay | Admitting: Family Medicine

## 2017-11-21 ENCOUNTER — Ambulatory Visit (INDEPENDENT_AMBULATORY_CARE_PROVIDER_SITE_OTHER): Payer: Medicare HMO | Admitting: Family Medicine

## 2017-11-21 VITALS — BP 124/82 | Ht 69.0 in | Wt 188.4 lb

## 2017-11-21 DIAGNOSIS — Z0000 Encounter for general adult medical examination without abnormal findings: Secondary | ICD-10-CM

## 2017-11-21 MED ORDER — ZOSTER VAC RECOMB ADJUVANTED 50 MCG/0.5ML IM SUSR: 0.5000 mL | Freq: Once | INTRAMUSCULAR | 1 refills | Status: AC

## 2017-11-21 MED ORDER — ZOSTER VAC RECOMB ADJUVANTED 50 MCG/0.5ML IM SUSR: 0.5000 mL | Freq: Once | INTRAMUSCULAR | 1 refills | Status: DC

## 2017-11-21 MED ORDER — PANTOPRAZOLE SODIUM 40 MG PO TBEC: 40.0000 mg | DELAYED_RELEASE_TABLET | Freq: Every day | ORAL | 5 refills | Status: DC

## 2017-11-21 NOTE — Patient Instructions (Addendum)
Results for orders placed or performed in visit on 06/06/17  Lipid panel  Result Value Ref Range   Cholesterol, Total 162 100 - 199 mg/dL   Triglycerides 128 0 - 149 mg/dL   HDL 43 >39 mg/dL   VLDL Cholesterol Cal 26 5 - 40 mg/dL   LDL Calculated 93 0 - 99 mg/dL   Chol/HDL Ratio 3.8 0.0 - 5.0 ratio  Hepatic function panel  Result Value Ref Range   Total Protein 7.0 6.0 - 8.5 g/dL   Albumin 4.2 3.5 - 4.8 g/dL   Bilirubin Total 0.7 0.0 - 1.2 mg/dL   Bilirubin, Direct 0.15 0.00 - 0.40 mg/dL   Alkaline Phosphatase 86 39 - 117 IU/L   AST 14 0 - 40 IU/L   ALT 11 0 - 44 IU/L  PSA  Result Value Ref Range   Prostate Specific Ag, Serum 0.5 0.0 - 4.0 ng/mL  Basic metabolic panel  Result Value Ref Range   Glucose 86 65 - 99 mg/dL   BUN 16 8 - 27 mg/dL   Creatinine, Ser 1.04 0.76 - 1.27 mg/dL   GFR calc non Af Amer 68 >59 mL/min/1.73   GFR calc Af Amer 79 >59 mL/min/1.73   BUN/Creatinine Ratio 15 10 - 24   Sodium 142 134 - 144 mmol/L   Potassium 4.1 3.5 - 5.2 mmol/L   Chloride 102 96 - 106 mmol/L   CO2 26 20 - 29 mmol/L   Calcium 9.6 8.6 - 10.2 mg/dL   Shingrix and shingles prevention: know the facts!   Shingrix is a very effective vaccine to prevent shingles.   Shingles is a reactivation of chickenpox -more than 99% of Americans born before 1980 have had chickenpox even if they do not remember it. One in every 10 people who get shingles have severe long-lasting nerve pain as a result.   33 out of a 100 older adults will get shingles if they are unvaccinated.     This vaccine is very important for your health This vaccine is indicated for anyone 50 years or older. You can get this vaccine even if you have already had shingles because you can get the disease more than once in a lifetime.  Your risk for shingles and its complications increases with age.  This vaccine has 2 doses.  The second dose would be 2 to 6 months after the first dose.  If you had Zostavax vaccine in the past  you should still get Shingrix. ( Zostavax is only 70% effective and it loses significant strength over a few years .)  This vaccine is given through the pharmacy.  The cost of the vaccine is through your insurance. The pharmacy can inform you of the total costs.  Common side effects including soreness in the arm, some redness and swelling, also some feel fatigue muscle soreness headache low-grade fever.  Side effects typically go away within 2 to 3 days. Remember-the pain from shingles can last a lifetime but these side effects of the vaccine will only last a few days at most. It is very important to get both doses in order to protect yourself fully.   Please get this vaccine at your earliest convenience at your trusted pharmacy.

## 2017-11-21 NOTE — Progress Notes (Signed)
   Subjective:    Patient ID: Benjamin Lowe, male    DOB: 1940/06/18, 77 y.o.   MRN: [id number]  HPI  AWV- Annual Wellness Visit  The patient was seen for their annual wellness visit. The patient's past medical history, surgical history, and family history were reviewed. Pertinent vaccines were reviewed ( tetanus, pneumonia, shingles, flu) The patient's medication list was reviewed and updated.  The height and weight were entered.  BMI recorded in electronic record elsewhere  Cognitive screening was completed. Outcome of Mini - Cog: pass   Falls /depression screening electronically recorded within record elsewhere  Current tobacco usage:none (All patients who use tobacco were given written and verbal information on quitting)  Recent listing of emergency department/hospitalizations over the past year were reviewed.  current specialist the patient sees on a regular basis: none   Medicare annual wellness visit patient questionnaire was reviewed.  A written screening schedule for the patient for the next 5-10 years was given. Appropriate discussion of followup regarding next visit was discussed.      Review of Systems  Constitutional: Negative for activity change, appetite change and fever.  HENT: Negative for congestion and rhinorrhea.   Eyes: Negative for discharge.  Respiratory: Negative for cough and wheezing.   Cardiovascular: Negative for chest pain.  Gastrointestinal: Negative for abdominal pain, blood in stool and vomiting.  Genitourinary: Negative for difficulty urinating and frequency.  Musculoskeletal: Negative for neck pain.  Skin: Negative for rash.  Allergic/Immunologic: Negative for environmental allergies and food allergies.  Neurological: Negative for weakness and headaches.  Psychiatric/Behavioral: Negative for agitation.       Objective:   Physical Exam  Constitutional: He appears well-developed and well-nourished.  HENT:  Head: Normocephalic  and atraumatic.  Right Ear: External ear normal.  Left Ear: External ear normal.  Nose: Nose normal.  Mouth/Throat: Oropharynx is clear and moist.  Eyes: Pupils are equal, round, and reactive to light. EOM are normal.  Neck: Normal range of motion. Neck supple. No thyromegaly present.  Cardiovascular: Normal rate, regular rhythm and normal heart sounds.  No murmur heard. Pulmonary/Chest: Effort normal and breath sounds normal. No respiratory distress. He has no wheezes.  Abdominal: Soft. Bowel sounds are normal. He exhibits no distension and no mass. There is no tenderness.  Genitourinary: Penis normal.  Musculoskeletal: Normal range of motion. He exhibits no edema.  Lymphadenopathy:    He has no cervical adenopathy.  Neurological: He is alert. He exhibits normal muscle tone.  Skin: Skin is warm and dry. No erythema.  Psychiatric: He has a normal mood and affect. His behavior is normal. Judgment normal.          Assessment & Plan:  Adult wellness-complete.wellness physical was conducted today. Importance of diet and exercise were discussed in detail.  In addition to this a discussion regarding safety was also covered. We also reviewed over immunizations and gave recommendations regarding current immunization needed for age.  In addition to this additional areas were also touched on including: Preventative health exams needed:  Colonoscopy up-to-date  Patient was advised yearly wellness exam Patient uses reflux medicine intermittently Uses Xanax intermittently Overall health is good Benjamin Lowe Grix discussed

## 2017-12-23 DIAGNOSIS — R69 Illness, unspecified: Secondary | ICD-10-CM | POA: Diagnosis not present

## 2018-01-05 DIAGNOSIS — D2262 Melanocytic nevi of left upper limb, including shoulder: Secondary | ICD-10-CM | POA: Diagnosis not present

## 2018-01-05 DIAGNOSIS — D0462 Carcinoma in situ of skin of left upper limb, including shoulder: Secondary | ICD-10-CM | POA: Diagnosis not present

## 2018-01-05 DIAGNOSIS — Z08 Encounter for follow-up examination after completed treatment for malignant neoplasm: Secondary | ICD-10-CM | POA: Diagnosis not present

## 2018-01-05 DIAGNOSIS — D485 Neoplasm of uncertain behavior of skin: Secondary | ICD-10-CM | POA: Diagnosis not present

## 2018-01-05 DIAGNOSIS — Z85828 Personal history of other malignant neoplasm of skin: Secondary | ICD-10-CM | POA: Diagnosis not present

## 2018-01-06 ENCOUNTER — Telehealth: Payer: Self-pay | Admitting: Family Medicine

## 2018-01-06 ENCOUNTER — Encounter: Payer: Self-pay | Admitting: Family Medicine

## 2018-01-06 ENCOUNTER — Ambulatory Visit (INDEPENDENT_AMBULATORY_CARE_PROVIDER_SITE_OTHER): Payer: Medicare HMO | Admitting: Family Medicine

## 2018-01-06 VITALS — BP 134/96 | Ht 69.0 in | Wt 188.0 lb

## 2018-01-06 DIAGNOSIS — L989 Disorder of the skin and subcutaneous tissue, unspecified: Secondary | ICD-10-CM | POA: Diagnosis not present

## 2018-01-06 NOTE — Progress Notes (Signed)
   Subjective:    Patient ID: Benjamin Lowe, male    DOB: Oct 27, 1940, 77 y.o.   MRN: [id number]  HPI Pt here today due to find a skin cancer on leg. Pt states he had skin cancer removed yesterday by Dr.Hall. Pt states he wants Dr.Samar Venneman to "calm him down". Take one half xanax every 8 hours.  Patient relates he is extremely nervous He states he had a couple different places diagnosed as skin cancer Had various treatments done tween biopsy and freezing He discovered a new spot on his leg He is very worried that this is a sign of severe cancer He is worried he is going to die from it He has significant anxiety related issues     Review of Systems 15 minutes was spent with patient today discussing healthcare issues which they came.  More than 50% of this visit-total duration of visit-was spent in counseling and coordination of care.  Please see diagnosis regarding the focus of this coordination and care     Objective:   Physical Exam  Examination of the chest abdomen back legs arm revealed 2 separate areas that have been frozen that are blistered one area that been biopsied also what appears to be a crusting on the anterior aspect of the left calf that could be a early skin cancer the rest of it is normal      Assessment & Plan:  We will call Dr. Juel Burrow office to request that they worked him in sooner to recheck this skin area I gave reassurance to the patient that I do not feel there is any severe cancer warnings were discussed with him  This area of the skin on his leg shows the possibility of being a skin cancer He was just seen by Dr. [place] Crane We are requesting that Dr. Juel Burrow office work him back into their schedule in the near future Patient very anxious would benefit from close follow-up from the specialist

## 2018-01-06 NOTE — Telephone Encounter (Signed)
FYI- patient's girlfriend Lenna Sciara) has made his appointment at [place] office on Monday at 10:50.

## 2018-01-09 DIAGNOSIS — D225 Melanocytic nevi of trunk: Secondary | ICD-10-CM | POA: Diagnosis not present

## 2018-01-09 DIAGNOSIS — C44712 Basal cell carcinoma of skin of right lower limb, including hip: Secondary | ICD-10-CM | POA: Diagnosis not present

## 2018-01-09 DIAGNOSIS — R69 Illness, unspecified: Secondary | ICD-10-CM | POA: Diagnosis not present

## 2018-02-09 DIAGNOSIS — Z85828 Personal history of other malignant neoplasm of skin: Secondary | ICD-10-CM | POA: Diagnosis not present

## 2018-02-09 DIAGNOSIS — Z08 Encounter for follow-up examination after completed treatment for malignant neoplasm: Secondary | ICD-10-CM | POA: Diagnosis not present

## 2018-03-16 ENCOUNTER — Other Ambulatory Visit: Payer: Self-pay | Admitting: Family Medicine

## 2018-03-16 NOTE — Telephone Encounter (Signed)
Six mo worth 

## 2018-04-24 DIAGNOSIS — R69 Illness, unspecified: Secondary | ICD-10-CM | POA: Diagnosis not present

## 2018-05-01 ENCOUNTER — Telehealth: Payer: Self-pay | Admitting: Family Medicine

## 2018-05-01 NOTE — Telephone Encounter (Signed)
Patient is scheduled for an appt on 05/12/18 and fiance' Burnis Kingfisher recommends that come day of appt do not tell patient of any test that need to be completed, she states it affects patient in an emotional stand point. Advise.   Melissa howard on DPR

## 2018-05-01 NOTE — Telephone Encounter (Signed)
We did the best we can and I have always taken into consideration his anxieties thank you

## 2018-05-10 ENCOUNTER — Ambulatory Visit: Payer: Medicare HMO | Admitting: Family Medicine

## 2018-05-12 ENCOUNTER — Encounter: Payer: Self-pay | Admitting: Family Medicine

## 2018-05-12 ENCOUNTER — Ambulatory Visit (INDEPENDENT_AMBULATORY_CARE_PROVIDER_SITE_OTHER): Payer: Medicare HMO | Admitting: Family Medicine

## 2018-05-12 VITALS — BP 132/88 | Ht 69.0 in | Wt 187.2 lb

## 2018-05-12 DIAGNOSIS — R69 Illness, unspecified: Secondary | ICD-10-CM | POA: Diagnosis not present

## 2018-05-12 DIAGNOSIS — F411 Generalized anxiety disorder: Secondary | ICD-10-CM

## 2018-05-12 DIAGNOSIS — R140 Abdominal distension (gaseous): Secondary | ICD-10-CM | POA: Diagnosis not present

## 2018-05-12 MED ORDER — FLUOXETINE HCL 10 MG PO CAPS: 10.0000 mg | ORAL_CAPSULE | Freq: Every day | ORAL | 3 refills | Status: DC

## 2018-05-12 NOTE — Progress Notes (Signed)
   Subjective:    Patient ID: Benjamin Lowe, male    DOB: [birth year], 78 y.o.   MRN: [id number]  Anxiety  Presents for follow-up visit. Patient reports no chest pain, dizziness, nausea or shortness of breath.    Patient states he is having an extraordinary amount of anxiety. Patient feels like he has butterflies in his stomach-so anxious. Significant anxiety symptoms along with intermittent depression symptoms in addition to this feeling overwhelmed at times in addition to this also having a lot of IBS symptoms bloating in the abdomen regurgitation issues acid reflux issues   Review of Systems  Constitutional: Negative for activity change, appetite change, fatigue and fever.  HENT: Negative for congestion and rhinorrhea.   Respiratory: Negative for cough and shortness of breath.   Cardiovascular: Negative for chest pain and leg swelling.  Gastrointestinal: Negative for abdominal pain, diarrhea, nausea and vomiting.  Genitourinary: Negative for dysuria and hematuria.  Neurological: Negative for dizziness, weakness and headaches.  Psychiatric/Behavioral: Negative for agitation and behavioral problems.       Objective:   Physical Exam Vitals signs reviewed.  Cardiovascular:     Rate and Rhythm: Normal rate and regular rhythm.     Heart sounds: Normal heart sounds. No murmur.  Pulmonary:     Effort: Pulmonary effort is normal.     Breath sounds: Normal breath sounds.  Lymphadenopathy:     Cervical: No cervical adenopathy.  Neurological:     Mental Status: He is alert.  Psychiatric:        Behavior: Behavior normal.      25 minutes was spent with the patient.  This statement verifies that 25 minutes was indeed spent with the patient.  More than 50% of this visit-total duration of the visit-was spent in counseling and coordination of care. The issues that the patient came in for today as reflected in the diagnosis (s) please refer to documentation for further details.        Assessment & Plan:  Extreme anxiety along with depression We discussed this at length Continue Xanax half tablet twice daily as needed do not exceed this Recommend Prozac 10 mg daily Patient did not tolerate Zoloft but hopefully will tolerate Prozac To follow-up if progressive troubles Patient was warned if he starts becoming suicidal or anything along that line immediately seek care ER behavioral health or here Otherwise recheck in 2 weeks Patient not suicidal  IBS symptoms Carafate as needed  GERD- Protonix on a regular basis  Small meals frequently recommended for the patient

## 2018-05-22 ENCOUNTER — Encounter: Payer: Self-pay | Admitting: Family Medicine

## 2018-05-22 ENCOUNTER — Other Ambulatory Visit: Payer: Self-pay

## 2018-05-22 ENCOUNTER — Ambulatory Visit (INDEPENDENT_AMBULATORY_CARE_PROVIDER_SITE_OTHER): Payer: Medicare HMO | Admitting: Family Medicine

## 2018-05-22 ENCOUNTER — Telehealth: Payer: Self-pay

## 2018-05-22 VITALS — BP 140/82

## 2018-05-22 DIAGNOSIS — F410 Panic disorder [episodic paroxysmal anxiety] without agoraphobia: Secondary | ICD-10-CM | POA: Diagnosis not present

## 2018-05-22 DIAGNOSIS — R69 Illness, unspecified: Secondary | ICD-10-CM | POA: Diagnosis not present

## 2018-05-22 NOTE — Telephone Encounter (Signed)
Patient was seen here at the office today and Prozac was d/c.

## 2018-05-22 NOTE — Telephone Encounter (Signed)
I called spoke with Lenna Sciara she states the patient was complaining last Sunday of extremities tingling, I asked if any chest pain or any other symptoms she says no. His stomach is better.

## 2018-05-22 NOTE — Progress Notes (Signed)
   Subjective:    Patient ID: Benjamin Lowe, male    DOB: February 05, 1941, 78 y.o.   MRN: [id number]  HPI  Patient arrives today stating he feels bad. Very nice gentleman Presents as a walk-in stating he feels very anxious nervous feels like his hands were trembling tingling he also states he feels very weak he feels like he is going to pass out if he does not lay down he denies any chest tightness pressure pain he states he worries that he is going to die he is being treated currently for anxiety and depression but states that he feels like the Prozac is making him worse Review of Systems  Constitutional: Negative for activity change, fatigue and fever.  HENT: Negative for congestion and rhinorrhea.   Respiratory: Negative for cough and shortness of breath.   Cardiovascular: Negative for chest pain and leg swelling.  Gastrointestinal: Negative for abdominal pain, diarrhea and nausea.  Genitourinary: Negative for dysuria and hematuria.  Neurological: Negative for weakness and headaches.  Psychiatric/Behavioral: Negative for agitation and behavioral problems.       Objective:   Physical Exam Constitutional:      General: He is not in acute distress.    Appearance: He is well-developed.  HENT:     Head: Normocephalic.  Cardiovascular:     Rate and Rhythm: Normal rate and regular rhythm.     Heart sounds: Normal heart sounds. No murmur.  Pulmonary:     Effort: Pulmonary effort is normal.     Breath sounds: Normal breath sounds.  Skin:    General: Skin is warm and dry.  Neurological:     Mental Status: He is alert.  Psychiatric:        Behavior: Behavior normal.    Thorough discussion and thorough evaluation find no evidence of any underlying issue currently I believe this is mainly a panic attack  Currently right now patient not having any chest tightness pressure pain or shortness of breath not diaphoretic skin warm dry     Assessment & Plan:  Severe anxiety panic  attack Stop Prozac Patient may use Xanax twice daily Patient in the past is not wanting to go for any type of counseling or psychiatric help He will follow-up in 1 week If his symptoms are still not to consider going to as well Reassurance given

## 2018-05-22 NOTE — Telephone Encounter (Signed)
Melissa girl friend aware.

## 2018-05-22 NOTE — Telephone Encounter (Signed)
He can stop the Prozac I recommend follow-up office visit tomorrow

## 2018-05-22 NOTE — Telephone Encounter (Signed)
Patient girlfriend called back and states the pt wanted to be seen today if possible for the tingling in extremities. Per girlfriend pt thinks Prozac is causing the tingling. She states if nothing available today can they see you tomorrow. She states he is in a [employer]. She says he has told her in the past he wanted to go on to [place] he was tired.She states she does not think he would harm [phone]

## 2018-05-24 ENCOUNTER — Ambulatory Visit: Payer: Medicare HMO | Admitting: Family Medicine

## 2018-05-25 ENCOUNTER — Ambulatory Visit (INDEPENDENT_AMBULATORY_CARE_PROVIDER_SITE_OTHER): Payer: Medicare HMO | Admitting: Family Medicine

## 2018-05-25 ENCOUNTER — Telehealth: Payer: Self-pay | Admitting: Family Medicine

## 2018-05-25 DIAGNOSIS — R69 Illness, unspecified: Secondary | ICD-10-CM | POA: Diagnosis not present

## 2018-05-25 DIAGNOSIS — F410 Panic disorder [episodic paroxysmal anxiety] without agoraphobia: Secondary | ICD-10-CM

## 2018-05-25 NOTE — Progress Notes (Signed)
Office visit Chief complaint anxious trembling nervous HPI- patient with known behavioral health issues presents today feeling anxious nervous restless feels like his heart is beating fast in addition this sweaty feels clammy in his hands also states he feels dizzy dry mouth does not feel like eating denies chest tightness pressure pain denies vomiting diarrhea fever chills sweats Patient has only been taking a half a tablet of Xanax daily and occasionally twice daily PMH behavioral health GERD  Vital signs blood pressure 138/86 Respiratory rate is normal lungs clear no crackle Heart regular no murmurs Extremities no edema skin warm dry neurologic grossly normal  It should be noted when I came in to see the patient he was restless, he was on his knees next to where his friend was sitting in a chair  The patient did respond to what I asked him to do He was able to get up follow commands he was coherent and able to represent himself well  We did discuss at length panic attacks origins I find no other cause on physical exam that indicates any other problems  Panic attack-may use Xanax half a tablet in the morning, half tablet mid afternoon, half a tablet in the evening  Maximum use would be a full tablet 4 times daily but hopefully he will have to do that-caution drowsiness  I reassured the patient that the Prozac no longer in the system  I encourage the patient to be seen by a counselor we will go ahead and make referral  Recheck the patient in 1 week  Patient more than likely will stay with a friend of his for the next few days he will follow-up sooner if any problems  25 minutes was spent with the patient.  This statement verifies that 25 minutes was indeed spent with the patient.  More than 50% of this visit-total duration of the visit-was spent in counseling and coordination of care. The issues that the patient came in for today as reflected in the diagnosis (s) please refer to  documentation for further details.

## 2018-05-25 NOTE — Addendum Note (Signed)
Addended by: Karle Barr on: 05/25/2018 09:47 AM   Modules accepted: Orders

## 2018-05-25 NOTE — Telephone Encounter (Signed)
Please advise 

## 2018-05-25 NOTE — Telephone Encounter (Signed)
We are seeing the patient currently thank you

## 2018-05-25 NOTE — Progress Notes (Signed)
Referral placed.

## 2018-05-25 NOTE — Telephone Encounter (Signed)
Patient is having withdraws from being on and off FLUoxetine (PROZAC) 10 MG capsule. Patient taken ALPRAZolam (XANAX) 0.25 MG tablet this morning at 3:30am, would like to know how soon can he have another dose instead of waiting until this evening. Advise.   Burnis Kingfisher on [facility]

## 2018-05-26 ENCOUNTER — Ambulatory Visit: Payer: Medicare HMO | Admitting: Family Medicine

## 2018-06-01 ENCOUNTER — Encounter: Payer: Self-pay | Admitting: Family Medicine

## 2018-06-02 ENCOUNTER — Encounter: Payer: Self-pay | Admitting: Family Medicine

## 2018-06-02 ENCOUNTER — Telehealth: Payer: Self-pay | Admitting: Family Medicine

## 2018-06-02 ENCOUNTER — Ambulatory Visit (INDEPENDENT_AMBULATORY_CARE_PROVIDER_SITE_OTHER): Payer: Medicare HMO | Admitting: Family Medicine

## 2018-06-02 VITALS — BP 136/82 | Ht 69.0 in | Wt 188.0 lb

## 2018-06-02 DIAGNOSIS — R69 Illness, unspecified: Secondary | ICD-10-CM | POA: Diagnosis not present

## 2018-06-02 DIAGNOSIS — F411 Generalized anxiety disorder: Secondary | ICD-10-CM

## 2018-06-02 DIAGNOSIS — F410 Panic disorder [episodic paroxysmal anxiety] without agoraphobia: Secondary | ICD-10-CM | POA: Insufficient documentation

## 2018-06-02 MED ORDER — SERTRALINE HCL 50 MG PO TABS: 50.0000 mg | ORAL_TABLET | Freq: Every day | ORAL | 2 refills | Status: DC

## 2018-06-02 NOTE — Telephone Encounter (Signed)
Advised patient per Dr Nicki Reaper he may take a half tablet up to 4 times per day  1 routine that Dr Nicki Reaper would recommend would be as follows  Take the first half tablet in the early morning somewhere between 630 and 7 May take the second half tablet near 1130 May take the third half tablet near 4  May take the last half tablet near 8 or 9 PM  As the sertraline does better for him he may get to the point where he only needs to do the Xanax half a tablet 3 times a day Patient should keep follow-up visit in a few weeks as planned  Patient verbalized understanding.

## 2018-06-02 NOTE — Telephone Encounter (Signed)
I discussed with the patient earlier half tablet up to 4 times per day  1 routine that I would recommend would be as follows  Take the first half tablet in the early morning somewhere between 630 and 7 May take the second half tablet near 1130 May take the third half tablet near 4  May take the last half tablet near 8 or 9 PM  As the sertraline does better for him he may get to the point where he only needs to do the Xanax half a tablet 3 times a day Patient should keep follow-up visit in a few weeks as planned

## 2018-06-02 NOTE — Progress Notes (Signed)
   Subjective:    Patient ID: Benjamin Lowe, male    DOB: 1941/02/22, 78 y.o.   MRN: [id number]  HPI Patient is here today to follow up on anxiety.  He states he was started on Zoloft 50 mg 1/2 at noon by a Dr. In [place]. Apparently the patient has a niece in [place] he was visiting her the niece has a friend who is a urgent care doctor with [employer] That particular doctor-Dr. Richardson Landry- called and spoke with me in between [place] we decided to go ahead and start sertraline Patient states he is doing much much better on the medicine less anxious less nervous feels much better He would like to know if he can start taking 1/2 bid. He states if he did this maybe he would not need the xanax.  He is taking Xanax 0.25 mg one tablet two times per day.  Review of Systems  Constitutional: Negative for activity change, appetite change and fatigue.  HENT: Negative for congestion and rhinorrhea.   Respiratory: Negative for cough and shortness of breath.   Cardiovascular: Negative for chest pain and leg swelling.  Gastrointestinal: Negative for abdominal pain, nausea and vomiting.  Neurological: Negative for dizziness and headaches.  Psychiatric/Behavioral: Negative for agitation and behavioral problems.       Objective:   Physical Exam Vitals signs reviewed.  Cardiovascular:     Rate and Rhythm: Normal rate and regular rhythm.     Heart sounds: Normal heart sounds. No murmur.  Pulmonary:     Effort: Pulmonary effort is normal.     Breath sounds: Normal breath sounds.  Lymphadenopathy:     Cervical: No cervical adenopathy.  Neurological:     Mental Status: He is alert.  Psychiatric:        Behavior: Behavior normal.           Assessment & Plan:  Panic attack, GAD, increased anxiety issues continue Prozac on a regular basis follow-up again in 3 to 4 weeks  Reduce Xanax recommend half tablet no more than 4 times a day Gradually will want to reduce this even further follow-up  sooner problems

## 2018-06-02 NOTE — Telephone Encounter (Signed)
Pt would like to know when he should take his xanax during the day.

## 2018-06-05 ENCOUNTER — Ambulatory Visit: Payer: Medicare HMO | Admitting: Family Medicine

## 2018-06-05 ENCOUNTER — Ambulatory Visit (INDEPENDENT_AMBULATORY_CARE_PROVIDER_SITE_OTHER): Payer: Medicare HMO | Admitting: Family Medicine

## 2018-06-05 DIAGNOSIS — F410 Panic disorder [episodic paroxysmal anxiety] without agoraphobia: Secondary | ICD-10-CM | POA: Diagnosis not present

## 2018-06-05 DIAGNOSIS — F411 Generalized anxiety disorder: Secondary | ICD-10-CM | POA: Diagnosis not present

## 2018-06-05 DIAGNOSIS — R69 Illness, unspecified: Secondary | ICD-10-CM | POA: Diagnosis not present

## 2018-06-05 NOTE — Progress Notes (Signed)
Patient was doing well on sertraline on his last visit we were trying to reduce his Xanax he states over the weekend he started feeling more anxious and nervous with panic attacks.  Unfortunately he stays by himself which worsens his overall issues The patient today was feeling anxious nervous.  Exhibiting signs of anxiety.  He is coherent and is not out-of-control 50 minutes spent with patient It was felt the patient is having a panic attack but his main issue is that he is just not able to take the Xanax as much.  I counted his pills.  I told him he could use a half a tablet preferably 4 5 times per day and no more than 6 a day-this would be the equivalent of no greater than 3 pills/day he was cautioned not to drive if he is feeling drowsy or drug and not to drive if he is having a panic attack patient was stable upon release we will recheck him in 1 week we will try to get him in with psychiatry urgently to help him with counseling  15 minutes was spent with patient today discussing healthcare issues which they came.  More than 50% of this visit-total duration of visit-was spent in counseling and coordination of care.  Please see diagnosis regarding the focus of this coordination and care

## 2018-06-12 ENCOUNTER — Other Ambulatory Visit: Payer: Self-pay

## 2018-06-12 ENCOUNTER — Ambulatory Visit (INDEPENDENT_AMBULATORY_CARE_PROVIDER_SITE_OTHER): Payer: Medicare HMO | Admitting: Family Medicine

## 2018-06-12 ENCOUNTER — Encounter: Payer: Self-pay | Admitting: Family Medicine

## 2018-06-12 VITALS — BP 138/86 | Wt 187.2 lb

## 2018-06-12 DIAGNOSIS — F411 Generalized anxiety disorder: Secondary | ICD-10-CM

## 2018-06-12 DIAGNOSIS — R69 Illness, unspecified: Secondary | ICD-10-CM | POA: Diagnosis not present

## 2018-06-12 MED ORDER — TRAZODONE HCL 50 MG PO TABS: 25.0000 mg | ORAL_TABLET | Freq: Every evening | ORAL | 3 refills | Status: DC | PRN

## 2018-06-12 MED ORDER — ALPRAZOLAM 0.25 MG PO TABS: ORAL_TABLET | ORAL | 5 refills | Status: DC

## 2018-06-12 MED ORDER — SERTRALINE HCL 50 MG PO TABS: 50.0000 mg | ORAL_TABLET | Freq: Every day | ORAL | 2 refills | Status: DC

## 2018-06-12 NOTE — Progress Notes (Signed)
   Subjective:    Patient ID: Benjamin Lowe, male    DOB: February 19, 1941, 78 y.o.   MRN: [id number]  Anxiety  Presents for follow-up visit.     Pt states he is doing much better. Pt wife has sent a MyChart message for provider to look at regarding medications.  He is less anxious he states his energy level doing better denies being depressed no panic attacks recently  Review of Systems     Objective:   Physical Exam    15 minutes was spent with patient today discussing healthcare issues which they came.  More than 50% of this visit-total duration of visit-was spent in counseling and coordination of care.  Please see diagnosis regarding the focus of this coordination and care Blood pressure recheck good lungs clear heart regular    Assessment & Plan:  Significant anxiety issues May use Xanax up to 4 times a day Continue the Zoloft Trazodone at nighttime  His male friend gave him Ativan's over the weekend and said that it worked better but the patient states that he wants to use the Xanax I strongly encouraged him not to mix medicines and never to drive if feeling drowsy

## 2018-06-13 ENCOUNTER — Telehealth: Payer: Self-pay | Admitting: Family Medicine

## 2018-06-13 NOTE — Telephone Encounter (Signed)
Tried to call [facility] and was put on hold for a long time to see when he last filled. Had to hang up. If you need this info I will try again

## 2018-06-13 NOTE — Telephone Encounter (Signed)
[facility] states med is due the 22nd. But pt still wants to fill today.

## 2018-06-13 NOTE — Telephone Encounter (Signed)
Called pt and told him insurance will not fill rx early and he has 8  Pills left. Pharm states he is due on march 22nd. Pt notified to go pick up on Sunday. He wanted me to call [facility] in [place] and make sure they will have it on Sunday and I did and was told pharm would have it ready.

## 2018-06-13 NOTE — Telephone Encounter (Signed)
Patient would like to go ahead and fill sertraline (ZOLOFT) 50 MG tablet today, patient is not out of medication but would like to have the medication on hand prior to running out, patient is asking if Dr.Scott can contact [place],  so he can receive refill early, advise.

## 2018-06-13 NOTE — Telephone Encounter (Signed)
Patient checking on status.

## 2018-06-13 NOTE — Telephone Encounter (Signed)
Patient states he was unable to refill sertraline (ZOLOFT) 50 MG tablet yesterday, pharmacy informed patient medication could not be refilled at this time due to being early, patient states he would like to refill medication today to have on hand before running out, patient has few days supply of current refill left. Advise.

## 2018-06-13 NOTE — Telephone Encounter (Signed)
Checking on status

## 2018-06-21 ENCOUNTER — Encounter: Payer: Self-pay | Admitting: Family Medicine

## 2018-06-21 MED ORDER — LORAZEPAM 0.5 MG PO TABS: ORAL_TABLET | ORAL | 1 refills | Status: DC

## 2018-06-21 NOTE — Telephone Encounter (Signed)
I would recommend stopping Xanax based upon the request May use lorazepam 0.5 mg 1 tablet every 6 hours as needed I would encourage Benjamin Lowe who is his helper to try to see if the patient can stick with using no more than 4/day and preferably most days 3 or less May have a prescription for 70 tablets with 1 refill

## 2018-06-21 NOTE — Addendum Note (Signed)
Addended by: Karle Barr on: 06/21/2018 11:34 AM   Modules accepted: Orders

## 2018-06-22 ENCOUNTER — Ambulatory Visit: Payer: Medicare HMO | Admitting: Family Medicine

## 2018-07-11 ENCOUNTER — Ambulatory Visit (HOSPITAL_COMMUNITY): Payer: Self-pay | Admitting: Licensed Clinical Social Worker

## 2018-07-20 ENCOUNTER — Encounter: Payer: Self-pay | Admitting: Family Medicine

## 2018-07-21 ENCOUNTER — Other Ambulatory Visit: Payer: Self-pay | Admitting: Family Medicine

## 2018-07-21 MED ORDER — LORAZEPAM 0.5 MG PO TABS
ORAL_TABLET | ORAL | 1 refills | Status: DC
Start: 2018-07-21 — End: 2018-08-04

## 2018-07-21 NOTE — Telephone Encounter (Signed)
A refill was sent into [facility] as requested  I recommend a virtual visit follow-up within the next 2 weeks

## 2018-08-04 ENCOUNTER — Ambulatory Visit (INDEPENDENT_AMBULATORY_CARE_PROVIDER_SITE_OTHER): Payer: Medicare HMO | Admitting: Family Medicine

## 2018-08-04 ENCOUNTER — Other Ambulatory Visit: Payer: Self-pay

## 2018-08-04 DIAGNOSIS — F410 Panic disorder [episodic paroxysmal anxiety] without agoraphobia: Secondary | ICD-10-CM

## 2018-08-04 DIAGNOSIS — F411 Generalized anxiety disorder: Secondary | ICD-10-CM | POA: Diagnosis not present

## 2018-08-04 DIAGNOSIS — R69 Illness, unspecified: Secondary | ICD-10-CM | POA: Diagnosis not present

## 2018-08-04 MED ORDER — LORAZEPAM 0.5 MG PO TABS
ORAL_TABLET | ORAL | 4 refills | Status: DC
Start: 2018-08-04 — End: 2018-12-28

## 2018-08-04 MED ORDER — SERTRALINE HCL 50 MG PO TABS: 50.0000 mg | ORAL_TABLET | Freq: Every day | ORAL | 5 refills | Status: DC

## 2018-08-04 NOTE — Progress Notes (Signed)
   Subjective:    Patient ID: Benjamin Lowe, male    DOB: 1941-03-29, 78 y.o.   MRN: [id number]  HPIAnxiety. Pt taking ativan 0.5mg  one tid and he states his rx is written for 70 tablets so he runs out. Pt states anxiety is much better as long as he has 3 tablets a day.  This gentleman suffers with longstanding anxiety related issues to the point of even having behavioral changes and panic attacks we have tried numerous medicines with him without much success.  It appears that he is doing well with the Ativan 0.5 mg he takes 1 in the morning 1 in the middle of the day and 1 later in the evening and he states it does an excellent job of allowing him to function.  He denies being depressed denies suicidal ideation.  Would like to continue this medicine for now.  We will go ahead and continue this medicine but I recommend a follow-up office visit this summer to discuss face-to-face Pt states no other concerns today.  Virtual Visit via Telephone Note  I connected with Benjamin Lowe on 08/04/18 at  2:30 PM EDT by telephone and verified that I am speaking with the correct person using two identifiers.  Location: Patient: home Provider: office   I discussed the limitations, risks, security and privacy concerns of performing an evaluation and management service by telephone and the availability of in person appointments. I also discussed with the patient that there may be a patient responsible charge related to this service. The patient expressed understanding and agreed to proceed.   History of Present Illness:    Observations/Objective:   Assessment and Plan:   Follow Up Instructions:    I discussed the assessment and treatment plan with the patient. The patient was provided an opportunity to ask questions and all were answered. The patient agreed with the plan and demonstrated an understanding of the instructions.   The patient was advised to call back or seek an in-person evaluation  if the symptoms worsen or if the condition fails to improve as anticipated.  I provided 15 minutes of non-face-to-face time during this encounter.       Review of Systems     Objective:   Physical Exam        Assessment & Plan:  Severe anxiety Panic attacks Doing well with medicine Follow-up within 6 weeks follow-up sooner problems

## 2018-09-12 ENCOUNTER — Ambulatory Visit (INDEPENDENT_AMBULATORY_CARE_PROVIDER_SITE_OTHER): Payer: Medicare HMO | Admitting: Family Medicine

## 2018-09-12 ENCOUNTER — Other Ambulatory Visit: Payer: Self-pay

## 2018-09-12 DIAGNOSIS — Z125 Encounter for screening for malignant neoplasm of prostate: Secondary | ICD-10-CM

## 2018-09-12 DIAGNOSIS — F411 Generalized anxiety disorder: Secondary | ICD-10-CM

## 2018-09-12 DIAGNOSIS — R69 Illness, unspecified: Secondary | ICD-10-CM | POA: Diagnosis not present

## 2018-09-12 DIAGNOSIS — Z79899 Other long term (current) drug therapy: Secondary | ICD-10-CM | POA: Diagnosis not present

## 2018-09-12 DIAGNOSIS — H00019 Hordeolum externum unspecified eye, unspecified eyelid: Secondary | ICD-10-CM | POA: Diagnosis not present

## 2018-09-12 DIAGNOSIS — E785 Hyperlipidemia, unspecified: Secondary | ICD-10-CM

## 2018-09-12 MED ORDER — CEPHALEXIN 500 MG PO CAPS: ORAL_CAPSULE | ORAL | 0 refills | Status: DC

## 2018-09-12 NOTE — Progress Notes (Signed)
   Subjective:    Patient ID: Benjamin Lowe, male    DOB: [birth year], 78 y.o.   MRN: [id number]  HPI Pt having phone visit for a follow up on anxiety. Pt states he doing well. Pt states he is doing OK on his medications.   Pt states he had a sty come up on his eye last week. Pt states he used hot compresses and that did help. Pt states the sty has went away but his eye is still a bit tender. Patient overall doing fairly well with his stress takes his Ativan 3 times a day had some severe anxiety related issues before this was started doing well functioning well with this I did talk with him today about possibly tapering down on the Ativan he is hesitant to do so Virtual Visit via Video Note  I connected with Benjamin Lowe on 09/12/18 at  9:00 AM EDT by a video enabled telemedicine application and verified that I am speaking with the correct person using two identifiers.  Location: Patient: home Provider: office   I discussed the limitations of evaluation and management by telemedicine and the availability of in person appointments. The patient expressed understanding and agreed to proceed.  History of Present Illness:    Observations/Objective:   Assessment and Plan:   Follow Up Instructions:    I discussed the assessment and treatment plan with the patient. The patient was provided an opportunity to ask questions and all were answered. The patient agreed with the plan and demonstrated an understanding of the instructions.   The patient was advised to call back or seek an in-person evaluation if the symptoms worsen or if the condition fails to improve as anticipated.  I provided 15 minutes of non-face-to-face time during this encounter.   Vicente Males, LPN    Review of Systems  Constitutional: Negative for activity change.  HENT: Negative for congestion and rhinorrhea.   Eyes: Negative for discharge, redness and itching.  Respiratory: Negative for cough and shortness  of breath.   Cardiovascular: Negative for chest pain.  Gastrointestinal: Negative for abdominal pain, diarrhea, nausea and vomiting.  Genitourinary: Negative for dysuria and hematuria.  Neurological: Negative for weakness and headaches.  Psychiatric/Behavioral: Negative for behavioral problems and confusion.       Objective:   Physical Exam Today's visit was via telephone Physical exam was not possible for this visit        Assessment & Plan:  Anxiety depression under good control continue current measures Continue current medicine Ativan I did encourage him to try to drop that to 2-1/2 tablets a day He will follow-up again with [place] in 3 months time sooner if any particular problems warning signs were discussed in detail  Based upon his conversation I do believe he has a stye I recommend Keflex for the next 5 days with warm compresses should gradually get better if ongoing troubles to notify us

## 2018-09-19 DIAGNOSIS — R69 Illness, unspecified: Secondary | ICD-10-CM | POA: Diagnosis not present

## 2018-09-20 ENCOUNTER — Other Ambulatory Visit: Payer: Self-pay

## 2018-09-20 ENCOUNTER — Ambulatory Visit (INDEPENDENT_AMBULATORY_CARE_PROVIDER_SITE_OTHER): Payer: Medicare HMO | Admitting: Family Medicine

## 2018-09-20 ENCOUNTER — Telehealth: Payer: Self-pay | Admitting: Family Medicine

## 2018-09-20 DIAGNOSIS — N529 Male erectile dysfunction, unspecified: Secondary | ICD-10-CM | POA: Diagnosis not present

## 2018-09-20 MED ORDER — SILDENAFIL CITRATE 100 MG PO TABS: ORAL_TABLET | ORAL | 12 refills | Status: DC

## 2018-09-20 NOTE — Progress Notes (Signed)
   Subjective:    Patient ID: Benjamin Lowe, male    DOB: 30-Jun-1940, 78 y.o.   MRN: [id number]  HPIpt wants rx for erectile dysfunction. States he does not want viagra.  Patient states he is interested in trying medication to help for erectile dysfunction Long discussion was held with the patient regarding various choices.  The patient states he would like to have generic of Viagra sildenafil.  We discussed a 20 mg or the 100 mg he would like to do the 100 mg I also told the patient that when trying this medicine he ought to start off with just a half a tablet and that if that does not do well enough next time full tablet and also encourage patient not to use it on a daily basis if he has any trouble he is to let us know Virtual Visit via Telephone Note  I connected with Benjamin Lowe on 09/20/18 at  4:40 PM EDT by telephone and verified that I am speaking with the correct person using two identifiers.  Location: Patient: home Provider: office   I discussed the limitations, risks, security and privacy concerns of performing an evaluation and management service by telephone and the availability of in person appointments. I also discussed with the patient that there may be a patient responsible charge related to this service. The patient expressed understanding and agreed to proceed.   History of Present Illness:    Observations/Objective:   Assessment and Plan:   Follow Up Instructions:    I discussed the assessment and treatment plan with the patient. The patient was provided an opportunity to ask questions and all were answered. The patient agreed with the plan and demonstrated an understanding of the instructions.   The patient was advised to call back or seek an in-person evaluation if the symptoms worsen or if the condition fails to improve as anticipated.  I provided 28minutes of non-face-to-face time during this encounter.      Review of Systems  Constitutional:  Negative for activity change, fatigue and fever.  HENT: Negative for congestion and rhinorrhea.   Respiratory: Negative for cough and shortness of breath.   Cardiovascular: Negative for chest pain and leg swelling.  Gastrointestinal: Negative for abdominal pain, diarrhea and nausea.  Genitourinary: Negative for dysuria and hematuria.  Neurological: Negative for weakness and headaches.  Psychiatric/Behavioral: Negative for agitation and behavioral problems.       Objective:   Physical Exam  Today's visit was via telephone Physical exam was not possible for this visit       Assessment & Plan:  Erectile dysfunction probably related to age I do not think it is related to his underlying stress related issues.  It is fine for this patient to use Viagra.  He is used it before.  The patient was encouraged that if he starts having any problems with the medicine stop the medicine notify us.  Also the patient was encouraged not to use medicine more than once every several days at the most start off with a half a tablet see discussion above

## 2018-09-20 NOTE — Telephone Encounter (Signed)
Please put him on the schedule for later today I will call him

## 2018-09-20 NOTE — Telephone Encounter (Signed)
Patient is wanting to discuss a private matter about some medication, He only wants to speak with [phone]

## 2018-09-21 NOTE — Telephone Encounter (Signed)
Patient was requesting generic Viagra did phone visit with him

## 2018-09-26 DIAGNOSIS — R69 Illness, unspecified: Secondary | ICD-10-CM | POA: Diagnosis not present

## 2018-10-16 ENCOUNTER — Other Ambulatory Visit: Payer: Self-pay

## 2018-10-16 ENCOUNTER — Ambulatory Visit (INDEPENDENT_AMBULATORY_CARE_PROVIDER_SITE_OTHER): Payer: Medicare HMO | Admitting: Otolaryngology

## 2018-10-16 DIAGNOSIS — H6123 Impacted cerumen, bilateral: Secondary | ICD-10-CM

## 2018-10-31 ENCOUNTER — Telehealth: Payer: Self-pay | Admitting: Family Medicine

## 2018-10-31 NOTE — Telephone Encounter (Signed)
FYI

## 2018-10-31 NOTE — Telephone Encounter (Signed)
Pt called to say he'll be getting lab work done tomorrow, would like [place] to mail the results to him

## 2018-10-31 NOTE — Telephone Encounter (Signed)
So noted 

## 2018-11-01 DIAGNOSIS — Z79899 Other long term (current) drug therapy: Secondary | ICD-10-CM | POA: Diagnosis not present

## 2018-11-01 DIAGNOSIS — Z125 Encounter for screening for malignant neoplasm of prostate: Secondary | ICD-10-CM | POA: Diagnosis not present

## 2018-11-01 DIAGNOSIS — E785 Hyperlipidemia, unspecified: Secondary | ICD-10-CM | POA: Diagnosis not present

## 2018-11-02 LAB — BASIC METABOLIC PANEL
BUN/Creatinine Ratio: 14 (ref 10–24)
BUN: 14 mg/dL (ref 8–27)
CO2: 25 mmol/L (ref 20–29)
Calcium: 9.4 mg/dL (ref 8.6–10.2)
Chloride: 101 mmol/L (ref 96–106)
Creatinine, Ser: 0.99 mg/dL (ref 0.76–1.27)
GFR calc Af Amer: 83 mL/min/{1.73_m2} (ref >59)
GFR calc non Af Amer: 72 mL/min/{1.73_m2} (ref >59)
Glucose: 95 mg/dL (ref 65–99)
Potassium: 4.3 mmol/L (ref 3.5–5.2)
Sodium: 141 mmol/L (ref 134–144)

## 2018-11-02 LAB — LIPID PANEL
Chol/HDL Ratio: 3.8 ratio (ref 0.0–5.0)
Cholesterol, Total: 180 mg/dL (ref 100–199)
HDL: 47 mg/dL (ref >39)
LDL Calculated: 101 mg/dL — ABNORMAL HIGH (ref 0–99)
Triglycerides: 160 mg/dL — ABNORMAL HIGH (ref 0–149)
VLDL Cholesterol Cal: 32 mg/dL (ref 5–40)

## 2018-11-02 LAB — PSA: Prostate Specific Ag, Serum: 0.5 ng/mL (ref 0.0–4.0)

## 2018-11-23 ENCOUNTER — Encounter: Payer: Self-pay | Admitting: Family Medicine

## 2018-12-13 DIAGNOSIS — R69 Illness, unspecified: Secondary | ICD-10-CM | POA: Diagnosis not present

## 2018-12-28 ENCOUNTER — Other Ambulatory Visit: Payer: Self-pay | Admitting: Family Medicine

## 2019-01-16 ENCOUNTER — Telehealth: Payer: Self-pay | Admitting: Family Medicine

## 2019-01-16 NOTE — Telephone Encounter (Signed)
Please print the labs Then forwarded to me so I can write a short note on it

## 2019-01-16 NOTE — Telephone Encounter (Signed)
Note on results states it will be reviewed with him at his visit but pt has not had a visit since doing bw and he has no upcoming appt. Can we mail results without it being signed off on? See note below states he does not want a call back with results because he will go to pieces

## 2019-01-16 NOTE — Telephone Encounter (Signed)
Labs printed and forwarded to dr scott's folder to add note.

## 2019-01-16 NOTE — Telephone Encounter (Signed)
Pt would like a copy of his recent lab results mailed to him, please do not call pt as this will make him "go all to pieces"  (anxiety)  Pt just hasn't heard from labs in August & would like a copy

## 2019-01-17 NOTE — Telephone Encounter (Signed)
I filled out an additional information this can be mailed to the patient

## 2019-01-22 ENCOUNTER — Telehealth: Payer: Self-pay | Admitting: Family Medicine

## 2019-01-22 NOTE — Telephone Encounter (Signed)
Patient calling saying he hasn't gotten his labwork yet.  I told him it was just mailed out at the end of last week and it hasn't been enough time for it to get to him in the mail yet.  Patient didn't want to wait, even though I explained that Dr. Nicki Reaper had written info on his labs, so patient came and picked up another copy without the note on it.

## 2019-03-19 ENCOUNTER — Other Ambulatory Visit: Payer: Self-pay | Admitting: Family Medicine

## 2019-03-19 MED ORDER — LORAZEPAM 0.5 MG PO TABS: ORAL_TABLET | ORAL | 0 refills | Status: DC

## 2019-03-19 NOTE — Telephone Encounter (Signed)
Pt is scheduled for 1/7. Pt would also like to know how long Dr. Nicki Reaper would like him to continue the zoloft and if he can cut it in half.

## 2019-03-19 NOTE — Telephone Encounter (Signed)
Please advise. Thank you

## 2019-03-19 NOTE — Telephone Encounter (Signed)
I will do 1 refill patient needs virtual visit please

## 2019-03-19 NOTE — Telephone Encounter (Signed)
This is a duplicate.

## 2019-03-19 NOTE — Telephone Encounter (Signed)
Please contact patient to set up virtual visit. Thank you

## 2019-04-05 ENCOUNTER — Ambulatory Visit (INDEPENDENT_AMBULATORY_CARE_PROVIDER_SITE_OTHER): Payer: Medicare HMO | Admitting: Family Medicine

## 2019-04-05 ENCOUNTER — Other Ambulatory Visit: Payer: Self-pay

## 2019-04-05 DIAGNOSIS — F411 Generalized anxiety disorder: Secondary | ICD-10-CM | POA: Diagnosis not present

## 2019-04-05 DIAGNOSIS — R69 Illness, unspecified: Secondary | ICD-10-CM | POA: Diagnosis not present

## 2019-04-05 MED ORDER — SERTRALINE HCL 50 MG PO TABS: 25.0000 mg | ORAL_TABLET | Freq: Every day | ORAL | 5 refills | Status: DC

## 2019-04-05 MED ORDER — LORAZEPAM 0.5 MG PO TABS: ORAL_TABLET | ORAL | 5 refills | Status: DC

## 2019-04-05 NOTE — Progress Notes (Signed)
   Subjective:    Patient ID: Benjamin Lowe, male    DOB: March 21, 1941, 79 y.o.   MRN: [id number]  Patient calls for a follow up on anxiety. Patient states he has been doing well and has cut down his meds. Patient states he only take 2 ativan a day and half of a Zoloft. Patient wonders if he can go to a quarter of a pill on the Zoloft.  Patient states his moods overall doing much better.  He is taking a healthy approach to life eating well drinking well moving around well denies any setbacks. Virtual Visit via Video Note  I connected with Benjamin Lowe on 04/05/19 at 10:30 AM EST by a video enabled telemedicine application and verified that I am speaking with the correct person using two identifiers.  Location: Patient: home Provider: office   I discussed the limitations of evaluation and management by telemedicine and the availability of in person appointments. The patient expressed understanding and agreed to proceed.  History of Present Illness:    Observations/Objective:   Assessment and Plan:   Follow Up Instructions:    I discussed the assessment and treatment plan with the patient. The patient was provided an opportunity to ask questions and all were answered. The patient agreed with the plan and demonstrated an understanding of the instructions.   The patient was advised to call back or seek an in-person evaluation if the symptoms worsen or if the condition fails to improve as anticipated.  I provided 16 minutes of non-face-to-face time during this encounter.      Review of Systems  Constitutional: Negative for activity change, fatigue and fever.  HENT: Negative for congestion and rhinorrhea.   Respiratory: Negative for cough and shortness of breath.   Cardiovascular: Negative for chest pain and leg swelling.  Gastrointestinal: Negative for abdominal pain, diarrhea and nausea.  Genitourinary: Negative for dysuria and hematuria.  Neurological: Negative for  weakness and headaches.  Psychiatric/Behavioral: Negative for agitation and behavioral problems.       Objective:   Physical Exam   Today's visit was via telephone Physical exam was not possible for this visit     Assessment & Plan:  Overall patient doing well Refills given on medicine May reduce Zoloft to half tablet daily he states he is actually doing well with that and he will cut it back to 1/4 tablet daily as long as he is doing well that is fine he will follow-up within 6 months no need for lab work currently

## 2019-04-17 ENCOUNTER — Other Ambulatory Visit: Payer: Self-pay | Admitting: Family Medicine

## 2019-05-01 DIAGNOSIS — R69 Illness, unspecified: Secondary | ICD-10-CM | POA: Diagnosis not present

## 2019-09-22 ENCOUNTER — Other Ambulatory Visit: Payer: Self-pay | Admitting: Family Medicine

## 2019-09-24 ENCOUNTER — Other Ambulatory Visit: Payer: Self-pay | Admitting: *Deleted

## 2019-09-24 MED ORDER — SERTRALINE HCL 50 MG PO TABS: ORAL_TABLET | ORAL | 0 refills | Status: DC

## 2019-09-24 NOTE — Telephone Encounter (Signed)
May have 1 refill needs to do follow-up visit 

## 2019-10-17 DIAGNOSIS — R69 Illness, unspecified: Secondary | ICD-10-CM | POA: Diagnosis not present

## 2019-10-23 ENCOUNTER — Other Ambulatory Visit: Payer: Self-pay | Admitting: Family Medicine

## 2019-10-23 DIAGNOSIS — E785 Hyperlipidemia, unspecified: Secondary | ICD-10-CM

## 2019-10-23 DIAGNOSIS — Z79899 Other long term (current) drug therapy: Secondary | ICD-10-CM

## 2019-10-23 DIAGNOSIS — Z125 Encounter for screening for malignant neoplasm of prostate: Secondary | ICD-10-CM

## 2019-10-24 NOTE — Telephone Encounter (Signed)
Tried to call number in chart and busy signal

## 2019-10-26 NOTE — Telephone Encounter (Signed)
Tried to call number in chart and busy signal

## 2019-10-26 NOTE — Telephone Encounter (Signed)
Scheduled 9/9

## 2019-11-06 DIAGNOSIS — Z79899 Other long term (current) drug therapy: Secondary | ICD-10-CM | POA: Diagnosis not present

## 2019-11-06 DIAGNOSIS — Z125 Encounter for screening for malignant neoplasm of prostate: Secondary | ICD-10-CM | POA: Diagnosis not present

## 2019-11-06 DIAGNOSIS — E785 Hyperlipidemia, unspecified: Secondary | ICD-10-CM | POA: Diagnosis not present

## 2019-11-07 LAB — HEPATIC FUNCTION PANEL
ALT: 12 IU/L (ref 0–44)
AST: 13 IU/L (ref 0–40)
Albumin: 4.4 g/dL (ref 3.7–4.7)
Alkaline Phosphatase: 87 IU/L (ref 48–121)
Bilirubin Total: 0.7 mg/dL (ref 0.0–1.2)
Bilirubin, Direct: 0.15 mg/dL (ref 0.00–0.40)
Total Protein: 7.7 g/dL (ref 6.0–8.5)

## 2019-11-07 LAB — BASIC METABOLIC PANEL
BUN/Creatinine Ratio: 16 (ref 10–24)
BUN: 17 mg/dL (ref 8–27)
CO2: 24 mmol/L (ref 20–29)
Calcium: 9.3 mg/dL (ref 8.6–10.2)
Chloride: 100 mmol/L (ref 96–106)
Creatinine, Ser: 1.06 mg/dL (ref 0.76–1.27)
GFR calc Af Amer: 76 mL/min/{1.73_m2} (ref >59)
GFR calc non Af Amer: 66 mL/min/{1.73_m2} (ref >59)
Glucose: 92 mg/dL (ref 65–99)
Potassium: 4.2 mmol/L (ref 3.5–5.2)
Sodium: 138 mmol/L (ref 134–144)

## 2019-11-07 LAB — LIPID PANEL
Chol/HDL Ratio: 4.1 ratio (ref 0.0–5.0)
Cholesterol, Total: 199 mg/dL (ref 100–199)
HDL: 49 mg/dL (ref >39)
LDL Chol Calc (NIH): 120 mg/dL — ABNORMAL HIGH (ref 0–99)
Triglycerides: 173 mg/dL — ABNORMAL HIGH (ref 0–149)
VLDL Cholesterol Cal: 30 mg/dL (ref 5–40)

## 2019-11-07 LAB — PSA: Prostate Specific Ag, Serum: 0.4 ng/mL (ref 0.0–4.0)

## 2019-11-29 ENCOUNTER — Ambulatory Visit (INDEPENDENT_AMBULATORY_CARE_PROVIDER_SITE_OTHER): Payer: Medicare HMO | Admitting: Family Medicine

## 2019-11-29 ENCOUNTER — Other Ambulatory Visit: Payer: Self-pay

## 2019-11-29 VITALS — BP 134/86 | Wt 192.8 lb

## 2019-11-29 DIAGNOSIS — E785 Hyperlipidemia, unspecified: Secondary | ICD-10-CM

## 2019-11-29 DIAGNOSIS — F411 Generalized anxiety disorder: Secondary | ICD-10-CM | POA: Diagnosis not present

## 2019-11-29 DIAGNOSIS — R69 Illness, unspecified: Secondary | ICD-10-CM | POA: Diagnosis not present

## 2019-11-29 MED ORDER — LORAZEPAM 0.5 MG PO TABS: ORAL_TABLET | ORAL | 3 refills | Status: DC

## 2019-11-29 MED ORDER — SERTRALINE HCL 25 MG PO TABS: ORAL_TABLET | ORAL | 1 refills | Status: DC

## 2019-11-29 NOTE — Patient Instructions (Addendum)
DASH Eating Plan °DASH stands for "Dietary Approaches to Stop Hypertension." The DASH eating plan is a healthy eating plan that has been shown to reduce high blood pressure (hypertension). It may also reduce your risk for type 2 diabetes, heart disease, and stroke. The DASH eating plan may also help with weight loss. °What are tips for following this plan? ° °General guidelines °· Avoid eating more than 2,300 mg (milligrams) of salt (sodium) a day. If you have hypertension, you may need to reduce your sodium intake to 1,500 mg a day. °· Limit alcohol intake to no more than 1 drink a day for nonpregnant women and 2 drinks a day for men. One drink equals 12 oz of beer, 5 oz of wine, or 1½ oz of hard liquor. °· Work with your health care provider to maintain a healthy body weight or to lose weight. Ask what an ideal weight is for you. °· Get at least 30 minutes of exercise that causes your heart to beat faster (aerobic exercise) most days of the week. Activities may include walking, swimming, or biking. °· Work with your health care provider or diet and nutrition specialist (dietitian) to adjust your eating plan to your individual calorie needs. °Reading food labels ° °· Check food labels for the amount of sodium per serving. Choose foods with less than 5 percent of the Daily Value of sodium. Generally, foods with less than 300 mg of sodium per serving fit into this eating plan. °· To find whole grains, look for the word "whole" as the first word in the ingredient list. °Shopping °· Buy products labeled as "low-sodium" or "no salt added." °· Buy fresh foods. Avoid canned foods and premade or frozen meals. °Cooking °· Avoid adding salt when cooking. Use salt-free seasonings or herbs instead of table salt or sea salt. Check with your health care provider or pharmacist before using salt substitutes. °· Do not fry foods. Cook foods using healthy methods such as baking, boiling, grilling, and broiling instead. °· Cook with  heart-healthy oils, such as olive, canola, soybean, or sunflower oil. °Meal planning °· Eat a balanced diet that includes: °? 5 or more servings of fruits and vegetables each day. At each meal, try to fill half of your plate with fruits and vegetables. °? Up to 6-8 servings of whole grains each day. °? Less than 6 oz of lean meat, poultry, or fish each day. A 3-oz serving of meat is about the same size as a deck of cards. One egg equals 1 oz. °? 2 servings of low-fat dairy each day. °? A serving of nuts, seeds, or beans 5 times each week. °? Heart-healthy fats. Healthy fats called Omega-3 fatty acids are found in foods such as flaxseeds and coldwater fish, like sardines, salmon, and mackerel. °· Limit how much you eat of the following: °? Canned or prepackaged foods. °? Food that is high in trans fat, such as fried foods. °? Food that is high in saturated fat, such as fatty meat. °? Sweets, desserts, sugary drinks, and other foods with added sugar. °? Full-fat dairy products. °· Do not salt foods before eating. °· Try to eat at least 2 vegetarian meals each week. °· Eat more home-cooked food and less restaurant, buffet, and fast food. °· When eating at a restaurant, ask that your food be prepared with less salt or no salt, if possible. °What foods are recommended? °The items listed may not be a complete list. Talk with your dietitian about   what dietary choices are best for you. °Grains °Whole-grain or whole-wheat bread. Whole-grain or whole-wheat pasta. Brown rice. Oatmeal. Quinoa. Bulgur. Whole-grain and low-sodium cereals. Pita bread. Low-fat, low-sodium crackers. Whole-wheat flour tortillas. °Vegetables °Fresh or frozen vegetables (raw, steamed, roasted, or grilled). Low-sodium or reduced-sodium tomato and vegetable juice. Low-sodium or reduced-sodium tomato sauce and tomato paste. Low-sodium or reduced-sodium canned vegetables. °Fruits °All fresh, dried, or frozen fruit. Canned fruit in natural juice (without  added sugar). °Meat and other protein foods °Skinless chicken or turkey. Ground chicken or turkey. Pork with fat trimmed off. Fish and seafood. Egg whites. Dried beans, peas, or lentils. Unsalted nuts, nut butters, and seeds. Unsalted canned beans. Lean cuts of beef with fat trimmed off. Low-sodium, lean deli meat. °Dairy °Low-fat (1%) or fat-free (skim) milk. Fat-free, low-fat, or reduced-fat cheeses. Nonfat, low-sodium ricotta or cottage cheese. Low-fat or nonfat yogurt. Low-fat, low-sodium cheese. °Fats and oils °Soft margarine without trans fats. Vegetable oil. Low-fat, reduced-fat, or light mayonnaise and salad dressings (reduced-sodium). Canola, safflower, olive, soybean, and sunflower oils. Avocado. °Seasoning and other foods °Herbs. Spices. Seasoning mixes without salt. Unsalted popcorn and pretzels. Fat-free sweets. °What foods are not recommended? °The items listed may not be a complete list. Talk with your dietitian about what dietary choices are best for you. °Grains °Baked goods made with fat, such as croissants, muffins, or some breads. Dry pasta or rice meal packs. °Vegetables °Creamed or fried vegetables. Vegetables in a cheese sauce. Regular canned vegetables (not low-sodium or reduced-sodium). Regular canned tomato sauce and paste (not low-sodium or reduced-sodium). Regular tomato and vegetable juice (not low-sodium or reduced-sodium). Pickles. Olives. °Fruits °Canned fruit in a light or heavy syrup. Fried fruit. Fruit in cream or butter sauce. °Meat and other protein foods °Fatty cuts of meat. Ribs. Fried meat. Bacon. Sausage. Bologna and other processed lunch meats. Salami. Fatback. Hotdogs. Bratwurst. Salted nuts and seeds. Canned beans with added salt. Canned or smoked fish. Whole eggs or egg yolks. Chicken or turkey with skin. °Dairy °Whole or 2% milk, cream, and half-and-half. Whole or full-fat cream cheese. Whole-fat or sweetened yogurt. Full-fat cheese. Nondairy creamers. Whipped toppings.  Processed cheese and cheese spreads. °Fats and oils °Butter. Stick margarine. Lard. Shortening. Ghee. Bacon fat. Tropical oils, such as coconut, palm kernel, or palm oil. °Seasoning and other foods °Salted popcorn and pretzels. Onion salt, garlic salt, seasoned salt, table salt, and sea salt. Worcestershire sauce. Tartar sauce. Barbecue sauce. Teriyaki sauce. Soy sauce, including reduced-sodium. Steak sauce. Canned and packaged gravies. Fish sauce. Oyster sauce. Cocktail sauce. Horseradish that you find on the shelf. Ketchup. Mustard. Meat flavorings and tenderizers. Bouillon cubes. Hot sauce and Tabasco sauce. Premade or packaged marinades. Premade or packaged taco seasonings. Relishes. Regular salad dressings. °Where to find more information: °· National Heart, Lung, and Blood Institute: www.nhlbi.nih.gov °· American Heart Association: www.heart.org °Summary °· The DASH eating plan is a healthy eating plan that has been shown to reduce high blood pressure (hypertension). It may also reduce your risk for type 2 diabetes, heart disease, and stroke. °· With the DASH eating plan, you should limit salt (sodium) intake to 2,300 mg a day. If you have hypertension, you may need to reduce your sodium intake to 1,500 mg a day. °· When on the DASH eating plan, aim to eat more fresh fruits and vegetables, whole grains, lean proteins, low-fat dairy, and heart-healthy fats. °· Work with your health care provider or diet and nutrition specialist (dietitian) to adjust your eating plan to your   individual calorie needs. This information is not intended to replace advice given to you by your health care provider. Make sure you discuss any questions you have with your health care provider. Document Revised: 02/25/2017 Document Reviewed: 03/08/2016 Elsevier Patient Education  [place].  Results for orders placed or performed in visit on 84/13/24  Basic metabolic panel  Result Value Ref Range   Glucose 92 65 - 99  mg/dL   BUN 17 8 - 27 mg/dL   Creatinine, Ser 1.06 0.76 - 1.27 mg/dL   GFR calc non Af Amer 66 >59 mL/min/1.73   GFR calc Af Amer 76 >59 mL/min/1.73   BUN/Creatinine Ratio 16 10 - 24   Sodium 138 134 - 144 mmol/L   Potassium 4.2 3.5 - 5.2 mmol/L   Chloride 100 96 - 106 mmol/L   CO2 24 20 - 29 mmol/L   Calcium 9.3 8.6 - 10.2 mg/dL  Lipid panel  Result Value Ref Range   Cholesterol, Total 199 100 - 199 mg/dL   Triglycerides 173 (H) 0 - 149 mg/dL   HDL 49 >39 mg/dL   VLDL Cholesterol Cal 30 5 - 40 mg/dL   LDL Chol Calc (NIH) 120 (H) 0 - 99 mg/dL   Chol/HDL Ratio 4.1 0.0 - 5.0 ratio  Hepatic function panel  Result Value Ref Range   Total Protein 7.7 6.0 - 8.5 g/dL   Albumin 4.4 3.7 - 4.7 g/dL   Bilirubin Total 0.7 0.0 - 1.2 mg/dL   Bilirubin, Direct 0.15 0.00 - 0.40 mg/dL   Alkaline Phosphatase 87 48 - 121 IU/L   AST 13 0 - 40 IU/L   ALT 12 0 - 44 IU/L  PSA  Result Value Ref Range   Prostate Specific Ag, Serum 0.4 0.0 - 4.0 ng/mL

## 2019-11-29 NOTE — Progress Notes (Signed)
   Subjective:    Patient ID: Benjamin Lowe, male    DOB: [birth year], 79 y.o.   MRN: [id number]  Depression        Associated symptoms include no fatigue, no appetite change and no headaches. Very nice gentleman new Here today to follow-up on anxiety He does get anxious has panic attacks intermittently does utilize low-dose SSRI has not tolerated higher doses Has been on nerve pills for years he has been able to reduce these down to the current level Denies alcohol use. Patient arrives for a follow up on depression and anxiety  Review of Systems  Constitutional: Negative for activity change, appetite change and fatigue.  HENT: Negative for congestion and rhinorrhea.   Respiratory: Negative for cough and shortness of breath.   Cardiovascular: Negative for chest pain and leg swelling.  Gastrointestinal: Negative for abdominal pain, nausea and vomiting.  Neurological: Negative for dizziness and headaches.  Psychiatric/Behavioral: Positive for depression. Negative for agitation and behavioral problems.       Objective:   Physical Exam Vitals reviewed.  Constitutional:      General: He is not in acute distress. HENT:     Head: Normocephalic and atraumatic.  Eyes:     General:        Right eye: No discharge.        Left eye: No discharge.  Neck:     Trachea: No tracheal deviation.  Cardiovascular:     Rate and Rhythm: Normal rate and regular rhythm.     Heart sounds: Normal heart sounds. No murmur heard.   Pulmonary:     Effort: Pulmonary effort is normal. No respiratory distress.     Breath sounds: Normal breath sounds.  Lymphadenopathy:     Cervical: No cervical adenopathy.  Skin:    General: Skin is warm and dry.  Neurological:     Mental Status: He is alert.     Coordination: Coordination normal.  Psychiatric:        Behavior: Behavior normal.      Fall Risk  11/21/2017 06/28/2016  Falls in the past year? No No          Assessment & Plan:  Patient has been  counseled not to use his nerve pills when he is driving.  Do not exceed 2/day.  Continue to sertraline to help with anxiety and panic attacks Follow-up in 6 months Recent labs look good.

## 2019-12-06 ENCOUNTER — Ambulatory Visit: Payer: Medicare HMO | Admitting: Family Medicine

## 2019-12-18 DIAGNOSIS — R69 Illness, unspecified: Secondary | ICD-10-CM | POA: Diagnosis not present

## 2020-01-22 DIAGNOSIS — R69 Illness, unspecified: Secondary | ICD-10-CM | POA: Diagnosis not present

## 2020-04-01 ENCOUNTER — Other Ambulatory Visit: Payer: Self-pay | Admitting: Family Medicine

## 2020-04-01 ENCOUNTER — Telehealth: Payer: Self-pay | Admitting: Family Medicine

## 2020-04-01 NOTE — Telephone Encounter (Signed)
Refill request has already been sent to provider

## 2020-04-01 NOTE — Telephone Encounter (Signed)
Patient is requesting refill for lorazepam 0.5 mg and sertraline 25 mg last filled 11/29/19 and last seen 11/29/19. [facility] [place]

## 2020-04-01 NOTE — Telephone Encounter (Signed)
Seen 11/29/19 for depression

## 2020-04-02 NOTE — Telephone Encounter (Signed)
Prescriptions were signed off on please have the patient do a follow-up visit in the spring

## 2020-04-07 ENCOUNTER — Telehealth: Payer: Self-pay

## 2020-04-07 NOTE — Telephone Encounter (Signed)
CE made a 6 month check up on 02/15 he said he don't really know what for can we do this as a virtual instead of him coming in since he is 80 years old?

## 2020-04-07 NOTE — Telephone Encounter (Signed)
This would be fine for him to do as a virtual

## 2020-04-25 NOTE — Telephone Encounter (Signed)
Patient has appointment on 2/15 check Blood pressure,heart rate

## 2020-05-06 NOTE — Telephone Encounter (Signed)
Schedule 2/15

## 2020-05-13 ENCOUNTER — Ambulatory Visit (INDEPENDENT_AMBULATORY_CARE_PROVIDER_SITE_OTHER): Payer: Medicare HMO | Admitting: Family Medicine

## 2020-05-13 ENCOUNTER — Encounter: Payer: Self-pay | Admitting: Family Medicine

## 2020-05-13 ENCOUNTER — Other Ambulatory Visit: Payer: Self-pay

## 2020-05-13 VITALS — BP 134/84 | Temp 97.3°F | Ht 69.0 in | Wt 193.6 lb

## 2020-05-13 DIAGNOSIS — K219 Gastro-esophageal reflux disease without esophagitis: Secondary | ICD-10-CM

## 2020-05-13 DIAGNOSIS — E785 Hyperlipidemia, unspecified: Secondary | ICD-10-CM | POA: Diagnosis not present

## 2020-05-13 DIAGNOSIS — F411 Generalized anxiety disorder: Secondary | ICD-10-CM

## 2020-05-13 DIAGNOSIS — R69 Illness, unspecified: Secondary | ICD-10-CM | POA: Diagnosis not present

## 2020-05-13 MED ORDER — SILDENAFIL CITRATE 100 MG PO TABS: ORAL_TABLET | ORAL | 12 refills | Status: DC

## 2020-05-13 MED ORDER — SERTRALINE HCL 25 MG PO TABS: 25.0000 mg | ORAL_TABLET | Freq: Every day | ORAL | 1 refills | Status: DC

## 2020-05-13 MED ORDER — LORAZEPAM 0.5 MG PO TABS
0.5000 mg | ORAL_TABLET | Freq: Two times a day (BID) | ORAL | 3 refills | Status: DC | PRN
Start: 2020-05-13 — End: 2020-10-21

## 2020-05-13 NOTE — Progress Notes (Signed)
Subjective:    Patient ID: Benjamin Lowe, male    DOB: 11-01-1940, 80 y.o.   MRN: [id number]  Hypertension This is a chronic problem. The current episode started more than 1 year ago. Pertinent negatives include no chest pain, headaches or shortness of breath. Risk factors for coronary artery disease include male gender. There are no compliance problems.    Needs refill on meds Generalized anxiety disorder under good control takes his medicine on a regular basis denies any major setbacks or problems.  Does uses lorazepam half tablet during the day 1 in the evening as needed.  Denies any problems with this  Reflux states under good control but he would like to use pantoprazole on a as needed basis denies any dysphagia  Does have erectile dysfunction would like to have a refill of Viagra as well.  Patient states his activity level is good.  Tries to eat healthy stay active.  Denies any falls or injuries.  Review of Systems  Constitutional: Negative for diaphoresis and fatigue.  HENT: Negative for congestion and rhinorrhea.   Respiratory: Negative for cough and shortness of breath.   Cardiovascular: Negative for chest pain and leg swelling.  Gastrointestinal: Negative for abdominal pain and diarrhea.  Skin: Negative for color change and rash.  Neurological: Negative for dizziness and headaches.  Psychiatric/Behavioral: Negative for behavioral problems and confusion.       Objective:   Physical Exam Vitals reviewed.  Constitutional:      General: He is not in acute distress.    Appearance: He is well-nourished.  HENT:     Head: Normocephalic and atraumatic.  Eyes:     General:        Right eye: No discharge.        Left eye: No discharge.  Neck:     Trachea: No tracheal deviation.  Cardiovascular:     Rate and Rhythm: Normal rate and regular rhythm.     Heart sounds: Normal heart sounds. No murmur heard.   Pulmonary:     Effort: Pulmonary effort is normal. No  respiratory distress.     Breath sounds: Normal breath sounds.  Musculoskeletal:        General: No edema.  Lymphadenopathy:     Cervical: No cervical adenopathy.  Skin:    General: Skin is warm and dry.  Neurological:     Mental Status: He is alert.     Coordination: Coordination normal.  Psychiatric:        Mood and Affect: Mood and affect normal.        Behavior: Behavior normal.     Results for orders placed or performed in visit on 44/01/02  Basic metabolic panel  Result Value Ref Range   Glucose 92 65 - 99 mg/dL   BUN 17 8 - 27 mg/dL   Creatinine, Ser 1.06 0.76 - 1.27 mg/dL   GFR calc non Af Amer 66 >59 mL/min/1.73   GFR calc Af Amer 76 >59 mL/min/1.73   BUN/Creatinine Ratio 16 10 - 24   Sodium 138 134 - 144 mmol/L   Potassium 4.2 3.5 - 5.2 mmol/L   Chloride 100 96 - 106 mmol/L   CO2 24 20 - 29 mmol/L   Calcium 9.3 8.6 - 10.2 mg/dL  Lipid panel  Result Value Ref Range   Cholesterol, Total 199 100 - 199 mg/dL   Triglycerides 173 (H) 0 - 149 mg/dL   HDL 49 >39 mg/dL   VLDL Cholesterol Cal 30 5 -  40 mg/dL   LDL Chol Calc (NIH) 120 (H) 0 - 99 mg/dL   Chol/HDL Ratio 4.1 0.0 - 5.0 ratio  Hepatic function panel  Result Value Ref Range   Total Protein 7.7 6.0 - 8.5 g/dL   Albumin 4.4 3.7 - 4.7 g/dL   Bilirubin Total 0.7 0.0 - 1.2 mg/dL   Bilirubin, Direct 0.15 0.00 - 0.40 mg/dL   Alkaline Phosphatase 87 48 - 121 IU/L   AST 13 0 - 40 IU/L   ALT 12 0 - 44 IU/L  PSA  Result Value Ref Range   Prostate Specific Ag, Serum 0.4 0.0 - 4.0 ng/mL    Labs were ordered for the purpose of this visit and was reviewed with patient     Assessment & Plan:  1. Hyperlipidemia, unspecified hyperlipidemia type Continue healthy eating regular physical activity patient does not want to be on statin currently.  Watch diet closely.  2. Generalized anxiety disorder Lorazepam may be utilized as directed helps him with his nerves he is a very anxious person.  Without it he has had  crippling anxiety.  Patient is aware not to drive if he is feeling drowsy.  Does use the Zoloft on a regular basis states it does help.  3. Gastroesophageal reflux disease without esophagitis Pantoprazole as needed  Erectile dysfunction refills given  Follow-up 6 months for wellness and follow-up on these issues

## 2020-05-19 ENCOUNTER — Other Ambulatory Visit: Payer: Self-pay | Admitting: Family Medicine

## 2020-08-22 ENCOUNTER — Telehealth: Payer: Self-pay

## 2020-08-22 NOTE — Telephone Encounter (Signed)
error 

## 2020-08-22 NOTE — Telephone Encounter (Signed)
Patient wants to know if Dr. Nicki Reaper recommends him to get his 2nd Booster.  According to chart, pt had boost last November.  He said he doesn't really want to get it unless Dr. Nicki Reaper recommends it.

## 2020-08-25 NOTE — Telephone Encounter (Signed)
Please let the patient know that it is in his best interest to get the second booster

## 2020-08-26 NOTE — Telephone Encounter (Signed)
Patient notified and verbalized understanding. 

## 2020-09-05 ENCOUNTER — Telehealth: Payer: Self-pay

## 2020-09-05 ENCOUNTER — Telehealth: Payer: Self-pay | Admitting: Family Medicine

## 2020-09-05 DIAGNOSIS — Z79899 Other long term (current) drug therapy: Secondary | ICD-10-CM

## 2020-09-05 DIAGNOSIS — E785 Hyperlipidemia, unspecified: Secondary | ICD-10-CM

## 2020-09-05 NOTE — Telephone Encounter (Signed)
Left message for patient to call back and schedule Medicare Annual Wellness Visit (AWV).   Please offer to do virtually or by telephone.   Last AWV:11/21/2017  Please schedule at anytime with Nurse Health Advisor.

## 2020-09-05 NOTE — Telephone Encounter (Signed)
Pt just made appt for AWV but wants to know if he needs any blood work done?   Pt call back [phone]

## 2020-09-05 NOTE — Telephone Encounter (Signed)
Last labs completed 11/06/19 PSA, hepatic, lipid and BMET. Please advise. Thank you

## 2020-09-07 NOTE — Telephone Encounter (Signed)
The appointment he is doing in July is that annual wellness visit which is more of a overview of his health and that is conducted by clinical nurse health advisor.  I would recommend a standard physical to be scheduled in August with CMP, lipid, CBC at that time

## 2020-09-08 NOTE — Telephone Encounter (Signed)
Blood work ordered in [facility]. Patient notified and scheduled follow up in August with Dr Nicki Reaper.

## 2020-09-11 ENCOUNTER — Telehealth: Payer: Self-pay | Admitting: Family Medicine

## 2020-09-11 NOTE — Telephone Encounter (Signed)
Patient is requesting labs has appointment in July for physical

## 2020-09-11 NOTE — Telephone Encounter (Signed)
Blood work was ordered in [place] 09/08/20- Patient was notified and verbalized understanding. See 09/05/20 phone message

## 2020-09-22 DIAGNOSIS — E785 Hyperlipidemia, unspecified: Secondary | ICD-10-CM | POA: Diagnosis not present

## 2020-09-22 DIAGNOSIS — Z79899 Other long term (current) drug therapy: Secondary | ICD-10-CM | POA: Diagnosis not present

## 2020-09-24 ENCOUNTER — Encounter: Payer: Self-pay | Admitting: Family Medicine

## 2020-09-24 LAB — COMPREHENSIVE METABOLIC PANEL
ALT: 13 IU/L (ref 0–44)
AST: 13 IU/L (ref 0–40)
Albumin/Globulin Ratio: 1.6 (ref 1.2–2.2)
Albumin: 4.4 g/dL (ref 3.7–4.7)
Alkaline Phosphatase: 82 IU/L (ref 44–121)
BUN/Creatinine Ratio: 16 (ref 10–24)
BUN: 16 mg/dL (ref 8–27)
Bilirubin Total: 0.7 mg/dL (ref 0.0–1.2)
CO2: 24 mmol/L (ref 20–29)
Calcium: 9.3 mg/dL (ref 8.6–10.2)
Chloride: 100 mmol/L (ref 96–106)
Creatinine, Ser: 1.01 mg/dL (ref 0.76–1.27)
Globulin, Total: 2.8 g/dL (ref 1.5–4.5)
Glucose: 94 mg/dL (ref 65–99)
Potassium: 4.6 mmol/L (ref 3.5–5.2)
Sodium: 140 mmol/L (ref 134–144)
Total Protein: 7.2 g/dL (ref 6.0–8.5)
eGFR: 75 mL/min/{1.73_m2} (ref >59)

## 2020-09-24 LAB — LIPID PANEL
Chol/HDL Ratio: 4 ratio (ref 0.0–5.0)
Cholesterol, Total: 172 mg/dL (ref 100–199)
HDL: 43 mg/dL (ref >39)
LDL Chol Calc (NIH): 98 mg/dL (ref 0–99)
Triglycerides: 177 mg/dL — ABNORMAL HIGH (ref 0–149)
VLDL Cholesterol Cal: 31 mg/dL (ref 5–40)

## 2020-09-24 LAB — CBC WITH DIFFERENTIAL/PLATELET
Basophils Absolute: 0 10*3/uL (ref 0.0–0.2)
Basos: 1 %
EOS (ABSOLUTE): 0.2 10*3/uL (ref 0.0–0.4)
Eos: 3 %
Hematocrit: 50.3 % (ref 37.5–51.0)
Hemoglobin: 16.2 g/dL (ref 13.0–17.7)
Immature Grans (Abs): 0 10*3/uL (ref 0.0–0.1)
Immature Granulocytes: 0 %
Lymphocytes Absolute: 2.2 10*3/uL (ref 0.7–3.1)
Lymphs: 33 %
MCH: 30.7 pg (ref 26.6–33.0)
MCHC: 32.2 g/dL (ref 31.5–35.7)
MCV: 95 fL (ref 79–97)
Monocytes Absolute: 0.8 10*3/uL (ref 0.1–0.9)
Monocytes: 13 %
Neutrophils Absolute: 3.4 10*3/uL (ref 1.4–7.0)
Neutrophils: 50 %
Platelets: 211 10*3/uL (ref 150–450)
RBC: 5.27 x10E6/uL (ref 4.14–5.80)
RDW: 12.5 % (ref 11.6–15.4)
WBC: 6.6 10*3/uL (ref 3.4–10.8)

## 2020-09-24 NOTE — Progress Notes (Signed)
Please mail the letter to the patient I am not sure that he uses MyChart thank you

## 2020-10-21 ENCOUNTER — Encounter: Payer: Self-pay | Admitting: Family Medicine

## 2020-10-21 ENCOUNTER — Other Ambulatory Visit: Payer: Self-pay

## 2020-10-21 ENCOUNTER — Ambulatory Visit (INDEPENDENT_AMBULATORY_CARE_PROVIDER_SITE_OTHER): Payer: Medicare HMO | Admitting: Family Medicine

## 2020-10-21 VITALS — BP 132/78 | Temp 97.2°F | Ht 68.0 in | Wt 195.2 lb

## 2020-10-21 DIAGNOSIS — Z0000 Encounter for general adult medical examination without abnormal findings: Secondary | ICD-10-CM

## 2020-10-21 DIAGNOSIS — R69 Illness, unspecified: Secondary | ICD-10-CM | POA: Diagnosis not present

## 2020-10-21 DIAGNOSIS — E781 Pure hyperglyceridemia: Secondary | ICD-10-CM | POA: Diagnosis not present

## 2020-10-21 DIAGNOSIS — Z0001 Encounter for general adult medical examination with abnormal findings: Secondary | ICD-10-CM | POA: Diagnosis not present

## 2020-10-21 DIAGNOSIS — F411 Generalized anxiety disorder: Secondary | ICD-10-CM | POA: Diagnosis not present

## 2020-10-21 MED ORDER — LORAZEPAM 0.5 MG PO TABS: 0.5000 mg | ORAL_TABLET | Freq: Two times a day (BID) | ORAL | 5 refills | Status: DC | PRN

## 2020-10-21 MED ORDER — SERTRALINE HCL 25 MG PO TABS: 25.0000 mg | ORAL_TABLET | Freq: Every day | ORAL | 1 refills | Status: DC

## 2020-10-21 MED ORDER — PANTOPRAZOLE SODIUM 40 MG PO TBEC: 40.0000 mg | DELAYED_RELEASE_TABLET | Freq: Every day | ORAL | 1 refills | Status: DC | PRN

## 2020-10-21 NOTE — Patient Instructions (Addendum)
You should do the updated covid booster Should be available in Oct Also do flu shot then We will recheck you in 6 months  Results for orders placed or performed in visit on 09/05/20  Comprehensive metabolic panel  Result Value Ref Range   Glucose 94 65 - 99 mg/dL   BUN 16 8 - 27 mg/dL   Creatinine, Ser 1.01 0.76 - 1.27 mg/dL   eGFR 75 >59 mL/min/1.73   BUN/Creatinine Ratio 16 10 - 24   Sodium 140 134 - 144 mmol/L   Potassium 4.6 3.5 - 5.2 mmol/L   Chloride 100 96 - 106 mmol/L   CO2 24 20 - 29 mmol/L   Calcium 9.3 8.6 - 10.2 mg/dL   Total Protein 7.2 6.0 - 8.5 g/dL   Albumin 4.4 3.7 - 4.7 g/dL   Globulin, Total 2.8 1.5 - 4.5 g/dL   Albumin/Globulin Ratio 1.6 1.2 - 2.2   Bilirubin Total 0.7 0.0 - 1.2 mg/dL   Alkaline Phosphatase 82 44 - 121 IU/L   AST 13 0 - 40 IU/L   ALT 13 0 - 44 IU/L  Lipid panel  Result Value Ref Range   Cholesterol, Total 172 100 - 199 mg/dL   Triglycerides 177 (H) 0 - 149 mg/dL   HDL 43 >39 mg/dL   VLDL Cholesterol Cal 31 5 - 40 mg/dL   LDL Chol Calc (NIH) 98 0 - 99 mg/dL   Chol/HDL Ratio 4.0 0.0 - 5.0 ratio  CBC with Differential/Platelet  Result Value Ref Range   WBC 6.6 3.4 - 10.8 x10E3/uL   RBC 5.27 4.14 - 5.80 x10E6/uL   Hemoglobin 16.2 13.0 - 17.7 g/dL   Hematocrit 50.3 37.5 - 51.0 %   MCV 95 79 - 97 fL   MCH 30.7 26.6 - 33.0 pg   MCHC 32.2 31.5 - 35.7 g/dL   RDW 12.5 11.6 - 15.4 %   Platelets 211 150 - 450 x10E3/uL   Neutrophils 50 Not Estab. %   Lymphs 33 Not Estab. %   Monocytes 13 Not Estab. %   Eos 3 Not Estab. %   Basos 1 Not Estab. %   Neutrophils Absolute 3.4 1.4 - 7.0 x10E3/uL   Lymphocytes Absolute 2.2 0.7 - 3.1 x10E3/uL   Monocytes Absolute 0.8 0.1 - 0.9 x10E3/uL   EOS (ABSOLUTE) 0.2 0.0 - 0.4 x10E3/uL   Basophils Absolute 0.0 0.0 - 0.2 x10E3/uL   Immature Granulocytes 0 Not Estab. %   Immature Grans (Abs) 0.0 0.0 - 0.1 x10E3/uL

## 2020-10-21 NOTE — Progress Notes (Signed)
   Subjective:    Patient ID: Benjamin Lowe, male    DOB: 1940-04-07, 80 y.o.   MRN: [id number]  HPI AWV- Annual Wellness Visit  The patient was seen for their annual wellness visit. The patient's past medical history, surgical history, and family history were reviewed. Pertinent vaccines were reviewed ( tetanus, pneumonia, shingles, flu) The patient's medication list was reviewed and updated.  The height and weight were entered.  BMI recorded in electronic record elsewhere  Cognitive screening was completed. Outcome of Mini - Cog: pass   Falls /depression screening electronically recorded within record elsewhere  Current tobacco usage:yes (All patients who use tobacco were given written and verbal information on quitting)  Recent listing of emergency department/hospitalizations over the past year were reviewed.  current specialist the patient sees on a regular basis: none   Medicare annual wellness visit patient questionnaire was reviewed.  A written screening schedule for the patient for the next 5-10 years was given. Appropriate discussion of followup regarding next visit was discussed.  Patient does have some underlying anxiety issues.  Gets very anxious quickly.  Takes low-dose sertraline on a daily basis and seems to do well with this Uses half to a whole Ativan twice daily denies drowsiness with it Patient knows not to operate machinery if feeling drowsy Denies being depressed currently  Encounter for subsequent annual wellness visit (AWV) in Medicare patient  Generalized anxiety disorder  Hypertriglyceridemia  Review of Systems     Objective:   Physical Exam General-in no acute distress Eyes-no discharge Lungs-respiratory rate normal, CTA CV-no murmurs,RRR Extremities skin warm dry no edema Neuro grossly normal Behavior normal, alert  PSA not indicated prostate exam not indicated      Assessment & Plan:  1. Encounter for subsequent annual wellness  visit (AWV) in Medicare patient Adult wellness-complete.wellness physical was conducted today. Importance of diet and exercise were discussed in detail.  In addition to this a discussion regarding safety was also covered. We also reviewed over immunizations and gave recommendations regarding current immunization needed for age.  In addition to this additional areas were also touched on including: Preventative health exams needed:  Colonoscopy not indicated  Patient was advised yearly wellness exam   2. Generalized anxiety disorder See discussion above.  Continue Zoloft 25 mg daily Ativan 0.5 mg 1 twice daily maximum patient encouraged to utilize half tablet when possible   3. Hypertriglyceridemia Healthy diet recommended.  No need to be on medication.  Follow-up 6 months

## 2020-11-13 ENCOUNTER — Encounter: Payer: Self-pay | Admitting: Emergency Medicine

## 2020-11-13 ENCOUNTER — Ambulatory Visit
Admission: EM | Admit: 2020-11-13 | Discharge: 2020-11-13 | Disposition: A | Discharge status: Home / Self Care | Payer: Medicare HMO | Attending: Emergency Medicine | Admitting: Emergency Medicine

## 2020-11-13 DIAGNOSIS — R0981 Nasal congestion: Secondary | ICD-10-CM | POA: Diagnosis not present

## 2020-11-13 DIAGNOSIS — H6123 Impacted cerumen, bilateral: Secondary | ICD-10-CM

## 2020-11-13 MED ORDER — PREDNISONE 20 MG PO TABS: 20.0000 mg | ORAL_TABLET | Freq: Two times a day (BID) | ORAL | 0 refills | Status: AC

## 2020-11-13 NOTE — Discharge Instructions (Addendum)
Get plenty of rest and push fluids Prednisone prescribed.   Follow up with PCP as needed Call or go to the ED if you have any new or worsening symptoms such as fever, cough, shortness of breath, chest tightness, chest pain, turning blue, changes in mental status, etc..Marland Kitchen

## 2020-11-13 NOTE — ED Triage Notes (Signed)
Pt states he has a sinus infection and needs some abx.  Cough and runny nose since yesterday.

## 2020-11-13 NOTE — ED Provider Notes (Signed)
[place]   [id number] 11/13/20 Arrival Time: 0920   CC: "sinus infection"  SUBJECTIVE: History from: patient.  Benjamin Lowe is a 80 y.o. male who presents with sinus congestion, runny nose, and drainage in back of throat and cough x 1 day.  Symptoms began after riding in the car with windows down.  Denies alleviating or aggravating factors.  Reports previous symptoms in the past with sinus infection.   Denies fever, chills, SOB, wheezing, chest pain, nausea, changes in bowel or bladder habits.    ROS: As per HPI.  All other pertinent ROS negative.     Past Medical History:  Diagnosis Date   Depression    "when I get nervous"   Past Surgical History:  Procedure Laterality Date   COLONOSCOPY     COLONOSCOPY N/A 12/27/2016   Procedure: COLONOSCOPY;  Surgeon: Rogene Houston, MD;  Location: [facility];  Service: Endoscopy;  Laterality: N/A;  1:00   KNEE SURGERY     Allergies  Allergen Reactions   Azithromycin Other (See Comments)    Zpack caused vomiting, vertigo   No current facility-administered medications on file prior to encounter.   Current Outpatient Medications on File Prior to Encounter  Medication Sig Dispense Refill   LORazepam (ATIVAN) 0.5 MG tablet Take 1 tablet (0.5 mg total) by mouth 2 (two) times daily as needed. 60 tablet 5   pantoprazole (PROTONIX) 40 MG tablet Take 1 tablet (40 mg total) by mouth daily as needed. 90 tablet 1   sertraline (ZOLOFT) 25 MG tablet Take 1 tablet (25 mg total) by mouth daily. 90 tablet 1   sildenafil (VIAGRA) 100 MG tablet TAKE ONE-HALF TO ONE TABLET BY MOUTH ONCE DAILY AS NEEDED 8 tablet 12   simethicone (MYLICON) 0000000 MG chewable tablet Chew 125 mg by mouth daily as needed for flatulence (gas).     Social History   Socioeconomic History   Marital status: Single    Spouse name: Not on file   Number of children: Not on file   Years of education: Not on file   Highest education level: Not on file   Occupational History   Not on file  Tobacco Use   Smoking status: Former   Smokeless tobacco: Never  Substance and Sexual Activity   Alcohol use: No   Drug use: No   Sexual activity: Not Currently  Other Topics Concern   Not on file  Social History Narrative   Not on file   Social Determinants of Health   Financial Resource Strain: Not on file  Food Insecurity: Not on file  Transportation Needs: Not on file  Physical Activity: Not on file  Stress: Not on file  Social Connections: Not on file  Intimate Partner Violence: Not on file   Family History  Problem Relation Age of Onset   Diabetes type II Mother    Other Father    Cancer - Colon Father    Parkinsonism Father     OBJECTIVE:  Vitals:   11/13/20 0930  BP: (!) 168/94  Pulse: (!) 106  Resp: 18  Temp: 98.7 F (37.1 C)  TempSrc: Oral  SpO2: 93%     General appearance: alert; appears fatigued, but nontoxic; speaking in full sentences and tolerating own secretions HEENT: NCAT; Ears: EACs with cerumen; Eyes: PERRL.  EOM grossly intact. Nose: nares patent without rhinorrhea, Throat: oropharynx clear, tonsils non erythematous or enlarged, uvula midline  Neck: supple without LAD Lungs: unlabored respirations, symmetrical air  entry; cough: absent; no respiratory distress; CTAB Heart: regular rate and rhythm.  Skin: warm and dry Psychological: alert and cooperative; normal mood and affect  ASSESSMENT & PLAN:  1. Sinus congestion   2. Excessive cerumen in both ear canals     Meds ordered this encounter  Medications   predniSONE (DELTASONE) 20 MG tablet    Sig: Take 1 tablet (20 mg total) by mouth 2 (two) times daily with a meal for 5 days.    Dispense:  10 tablet    Refill:  0    Order Specific Question:   Supervising Provider    Answer:   Raylene Everts [id number]   Declines ear lavage today Get plenty of rest and push fluids Prednisone prescribed.   Follow up with PCP as needed Call or go to the  ED if you have any new or worsening symptoms such as fever, cough, shortness of breath, chest tightness, chest pain, turning blue, changes in mental status, etc...   Reviewed expectations re: course of current medical issues. Questions answered. Outlined signs and symptoms indicating need for more acute intervention. Patient verbalized understanding. Pt left prior to receiving AVS          Lestine Box, PA-C 11/13/20 [id number]

## 2020-11-20 ENCOUNTER — Encounter: Payer: Self-pay | Admitting: *Deleted

## 2020-11-20 ENCOUNTER — Other Ambulatory Visit: Payer: Self-pay

## 2020-11-20 ENCOUNTER — Ambulatory Visit
Admission: EM | Admit: 2020-11-20 | Discharge: 2020-11-20 | Disposition: A | Discharge status: Home / Self Care | Payer: Medicare HMO | Attending: Emergency Medicine | Admitting: Emergency Medicine

## 2020-11-20 DIAGNOSIS — R0981 Nasal congestion: Secondary | ICD-10-CM

## 2020-11-20 DIAGNOSIS — J018 Other acute sinusitis: Secondary | ICD-10-CM

## 2020-11-20 MED ORDER — AMOXICILLIN-POT CLAVULANATE 875-125 MG PO TABS: 1.0000 | ORAL_TABLET | Freq: Two times a day (BID) | ORAL | 0 refills | Status: AC

## 2020-11-20 NOTE — ED Triage Notes (Signed)
Pt reports on going congestion with sinus drainage.

## 2020-11-20 NOTE — Discharge Instructions (Addendum)
Get plenty of rest and push fluids Augmentin prescribed for possible sinus infection Use OTC zyrtec for nasal congestion, runny nose, and/or sore throat Use OTC flonase for nasal congestion and runny nose Use medications daily for symptom relief Use OTC medications like ibuprofen or tylenol as needed fever or pain Call or go to the ED if you have any new or worsening symptoms such as fever, worsening cough, shortness of breath, chest tightness, chest pain, turning blue, changes in mental status, etc..Marland Kitchen

## 2020-11-20 NOTE — ED Provider Notes (Signed)
[place]   [id number] 11/20/20 Arrival Time: [id number]   CC: sinus congestion   SUBJECTIVE: History from: patient.  Benjamin Lowe is a 80 y.o. male who presents with congestion and sinus drainage x 1 week.  Denies sick exposure to COVID, flu or strep.  Has tried prednisone without relief.  Symptoms are made worse at night.  Reports previous symptoms in the past with sinus infection.   Denies fever, chills, SOB, wheezing, chest pain, nausea, changes in bowel or bladder habits.    ROS: As per HPI.  All other pertinent ROS negative.     Past Medical History:  Diagnosis Date   Depression    "when I get nervous"   Past Surgical History:  Procedure Laterality Date   COLONOSCOPY     COLONOSCOPY N/A 12/27/2016   Procedure: COLONOSCOPY;  Surgeon: Rogene Houston, MD;  Location: [facility];  Service: Endoscopy;  Laterality: N/A;  1:00   KNEE SURGERY     Allergies  Allergen Reactions   Azithromycin Other (See Comments)    Zpack caused vomiting, vertigo   No current facility-administered medications on file prior to encounter.   Current Outpatient Medications on File Prior to Encounter  Medication Sig Dispense Refill   LORazepam (ATIVAN) 0.5 MG tablet Take 1 tablet (0.5 mg total) by mouth 2 (two) times daily as needed. 60 tablet 5   pantoprazole (PROTONIX) 40 MG tablet Take 1 tablet (40 mg total) by mouth daily as needed. 90 tablet 1   sertraline (ZOLOFT) 25 MG tablet Take 1 tablet (25 mg total) by mouth daily. 90 tablet 1   sildenafil (VIAGRA) 100 MG tablet TAKE ONE-HALF TO ONE TABLET BY MOUTH ONCE DAILY AS NEEDED 8 tablet 12   simethicone (MYLICON) 0000000 MG chewable tablet Chew 125 mg by mouth daily as needed for flatulence (gas).     Social History   Socioeconomic History   Marital status: Single    Spouse name: Not on file   Number of children: Not on file   Years of education: Not on file   Highest education level: Not on file  Occupational History   Not on  file  Tobacco Use   Smoking status: Former   Smokeless tobacco: Never  Substance and Sexual Activity   Alcohol use: No   Drug use: No   Sexual activity: Not Currently  Other Topics Concern   Not on file  Social History Narrative   Not on file   Social Determinants of Health   Financial Resource Strain: Not on file  Food Insecurity: Not on file  Transportation Needs: Not on file  Physical Activity: Not on file  Stress: Not on file  Social Connections: Not on file  Intimate Partner Violence: Not on file   Family History  Problem Relation Age of Onset   Diabetes type II Mother    Other Father    Cancer - Colon Father    Parkinsonism Father     OBJECTIVE:  Vitals:   11/20/20 0933  BP: (!) 128/55  Pulse: 90  Resp: 18  Temp: 99.5 F (37.5 C)  TempSrc: Oral  SpO2: 98%     General appearance: alert; appears fatigued, but nontoxic; speaking in full sentences and tolerating own secretions HEENT: NCAT; Ears: EACs clear, TMs pearly gray; Eyes: PERRL.  EOM grossly intact. Nose: nares patent without rhinorrhea, Throat: oropharynx clear, tonsils non erythematous or enlarged, uvula midline  Neck: supple without LAD Lungs: unlabored respirations, symmetrical air entry;  cough: absent; no respiratory distress; CTAB Heart: regular rate and rhythm.   Skin: warm and dry Psychological: alert and cooperative; normal mood and affect  ASSESSMENT & PLAN:  1. Sinus congestion   2. Acute non-recurrent sinusitis of other sinus     Meds ordered this encounter  Medications   amoxicillin-clavulanate (AUGMENTIN) 875-125 MG tablet    Sig: Take 1 tablet by mouth every 12 (twelve) hours for 10 days.    Dispense:  20 tablet    Refill:  0    Order Specific Question:   Supervising Provider    Answer:   Raylene Everts [id number]    Get plenty of rest and push fluids Augmentin prescribed for possible sinus infection Use OTC zyrtec for nasal congestion, runny nose, and/or sore  throat Use OTC flonase for nasal congestion and runny nose Use medications daily for symptom relief Use OTC medications like ibuprofen or tylenol as needed fever or pain Call or go to the ED if you have any new or worsening symptoms such as fever, worsening cough, shortness of breath, chest tightness, chest pain, turning blue, changes in mental status, etc...   Reviewed expectations re: course of current medical issues. Questions answered. Outlined signs and symptoms indicating need for more acute intervention. Patient verbalized understanding. After Visit Summary given.          Lestine Box, PA-C 11/20/20 1011

## 2021-04-06 ENCOUNTER — Other Ambulatory Visit: Payer: Self-pay | Admitting: Family Medicine

## 2021-04-28 ENCOUNTER — Other Ambulatory Visit: Payer: Self-pay | Admitting: Family Medicine

## 2021-05-07 ENCOUNTER — Other Ambulatory Visit: Payer: Self-pay | Admitting: Family Medicine

## 2021-05-21 ENCOUNTER — Encounter: Payer: Self-pay | Admitting: Family Medicine

## 2021-05-21 ENCOUNTER — Other Ambulatory Visit: Payer: Self-pay

## 2021-05-21 ENCOUNTER — Ambulatory Visit (INDEPENDENT_AMBULATORY_CARE_PROVIDER_SITE_OTHER): Payer: Medicare HMO | Admitting: Family Medicine

## 2021-05-21 VITALS — BP 132/90 | Temp 98.1°F | Wt 192.8 lb

## 2021-05-21 DIAGNOSIS — F411 Generalized anxiety disorder: Secondary | ICD-10-CM

## 2021-05-21 DIAGNOSIS — R69 Illness, unspecified: Secondary | ICD-10-CM | POA: Diagnosis not present

## 2021-05-21 MED ORDER — LORAZEPAM 0.5 MG PO TABS: 0.5000 mg | ORAL_TABLET | Freq: Two times a day (BID) | ORAL | 5 refills | Status: DC | PRN

## 2021-05-21 NOTE — Progress Notes (Signed)
° °  Subjective:    Patient ID: Benjamin Lowe, male    DOB: 05/04/1940, 81 y.o.   MRN: [id number]  HPI Pt here for follow up. Pt is taking Ativan and Zoloft. Pt states doing well on meds.  Very nice patient Has a longstanding history of anxiety Finds himself feeling anxious if he does not take his medicines In the past has been incapacitated by his anxiousness but doing better on the Zoloft in doing fine with taking the Ativan denies it causing any drowsiness  Review of Systems     Objective:   Physical Exam  General-in no acute distress Eyes-no discharge Lungs-respiratory rate normal, CTA CV-no murmurs,RRR Extremities skin warm dry no edema Neuro grossly normal Behavior normal, alert       Assessment & Plan:  Longstanding history of anxiety and panic attacks Has been on Ativan for years tolerating it fine currently Sertraline daily Healthy diet regular activity recommended Patient to follow-up 6 months for wellness Lab work at that time Refills given

## 2021-06-23 ENCOUNTER — Telehealth: Payer: Self-pay | Admitting: *Deleted

## 2021-06-23 NOTE — Telephone Encounter (Signed)
[facility] stated that patient has requested that his Lorazepam 0.'5mg'$  script be increased for #60 to #65 a month because sometimes he has to take an extra tablet and needs the extra. The current script for one tablet BID PRN- Please advise (sig will also need to be adjusted for the extra pills per month) ?

## 2021-06-23 NOTE — Telephone Encounter (Signed)
I recommend that the patient stick with the current 60 tablets.  I also recommend the patient consider moving his appointment up into April or May if he would like to discuss this sooner ? ?I will not be increasing the dose of the medication based on a phone message from the pharmacy alone ?

## 2021-06-24 NOTE — Telephone Encounter (Signed)
Patient and pharmacy notified. Patient will call back if he wants to move up his appt. ?

## 2021-09-07 DIAGNOSIS — C44311 Basal cell carcinoma of skin of nose: Secondary | ICD-10-CM | POA: Diagnosis not present

## 2021-09-07 DIAGNOSIS — L821 Other seborrheic keratosis: Secondary | ICD-10-CM | POA: Diagnosis not present

## 2021-09-07 DIAGNOSIS — D225 Melanocytic nevi of trunk: Secondary | ICD-10-CM | POA: Diagnosis not present

## 2021-10-12 ENCOUNTER — Telehealth: Payer: Self-pay | Admitting: Family Medicine

## 2021-10-12 NOTE — Telephone Encounter (Signed)
Last labs completed 09/22/20 CBC, Lip and CMP. Please advise. Thank you

## 2021-10-12 NOTE — Telephone Encounter (Signed)
Patient has physical on 7/25 and lneeding labs done

## 2021-10-12 NOTE — Telephone Encounter (Signed)
CBC, CMP, lipid

## 2021-10-13 ENCOUNTER — Other Ambulatory Visit: Payer: Self-pay

## 2021-10-13 DIAGNOSIS — K219 Gastro-esophageal reflux disease without esophagitis: Secondary | ICD-10-CM

## 2021-10-13 DIAGNOSIS — E781 Pure hyperglyceridemia: Secondary | ICD-10-CM

## 2021-10-13 DIAGNOSIS — Z79899 Other long term (current) drug therapy: Secondary | ICD-10-CM

## 2021-10-13 DIAGNOSIS — Z0000 Encounter for general adult medical examination without abnormal findings: Secondary | ICD-10-CM

## 2021-10-13 NOTE — Telephone Encounter (Signed)
Patient has been informed per drs orders.  ?

## 2021-10-15 DIAGNOSIS — D225 Melanocytic nevi of trunk: Secondary | ICD-10-CM | POA: Diagnosis not present

## 2021-10-15 DIAGNOSIS — L821 Other seborrheic keratosis: Secondary | ICD-10-CM | POA: Diagnosis not present

## 2021-10-15 DIAGNOSIS — C44311 Basal cell carcinoma of skin of nose: Secondary | ICD-10-CM | POA: Diagnosis not present

## 2021-10-19 DIAGNOSIS — Z79899 Other long term (current) drug therapy: Secondary | ICD-10-CM | POA: Diagnosis not present

## 2021-10-19 DIAGNOSIS — Z0000 Encounter for general adult medical examination without abnormal findings: Secondary | ICD-10-CM | POA: Diagnosis not present

## 2021-10-19 DIAGNOSIS — E781 Pure hyperglyceridemia: Secondary | ICD-10-CM | POA: Diagnosis not present

## 2021-10-19 DIAGNOSIS — K219 Gastro-esophageal reflux disease without esophagitis: Secondary | ICD-10-CM | POA: Diagnosis not present

## 2021-10-20 ENCOUNTER — Ambulatory Visit (INDEPENDENT_AMBULATORY_CARE_PROVIDER_SITE_OTHER): Payer: Medicare HMO | Admitting: Family Medicine

## 2021-10-20 ENCOUNTER — Encounter: Payer: Self-pay | Admitting: Family Medicine

## 2021-10-20 VITALS — BP 150/100 | HR 97 | Temp 98.4°F | Ht 68.0 in | Wt 193.0 lb

## 2021-10-20 DIAGNOSIS — R030 Elevated blood-pressure reading, without diagnosis of hypertension: Secondary | ICD-10-CM | POA: Diagnosis not present

## 2021-10-20 DIAGNOSIS — F411 Generalized anxiety disorder: Secondary | ICD-10-CM | POA: Diagnosis not present

## 2021-10-20 DIAGNOSIS — Z0000 Encounter for general adult medical examination without abnormal findings: Secondary | ICD-10-CM | POA: Diagnosis not present

## 2021-10-20 DIAGNOSIS — E785 Hyperlipidemia, unspecified: Secondary | ICD-10-CM

## 2021-10-20 DIAGNOSIS — R69 Illness, unspecified: Secondary | ICD-10-CM | POA: Diagnosis not present

## 2021-10-20 LAB — COMPREHENSIVE METABOLIC PANEL
ALT: 11 IU/L (ref 0–44)
AST: 18 IU/L (ref 0–40)
Albumin/Globulin Ratio: 1.4 (ref 1.2–2.2)
Albumin: 4.3 g/dL (ref 3.7–4.7)
Alkaline Phosphatase: 88 IU/L (ref 44–121)
BUN/Creatinine Ratio: 14 (ref 10–24)
BUN: 15 mg/dL (ref 8–27)
Bilirubin Total: 0.8 mg/dL (ref 0.0–1.2)
CO2: 24 mmol/L (ref 20–29)
Calcium: 9.4 mg/dL (ref 8.6–10.2)
Chloride: 102 mmol/L (ref 96–106)
Creatinine, Ser: 1.1 mg/dL (ref 0.76–1.27)
Globulin, Total: 3 g/dL (ref 1.5–4.5)
Glucose: 103 mg/dL — ABNORMAL HIGH (ref 70–99)
Potassium: 4.5 mmol/L (ref 3.5–5.2)
Sodium: 140 mmol/L (ref 134–144)
Total Protein: 7.3 g/dL (ref 6.0–8.5)
eGFR: 67 mL/min/{1.73_m2} (ref >59)

## 2021-10-20 LAB — CBC WITH DIFFERENTIAL/PLATELET
Basophils Absolute: 0 10*3/uL (ref 0.0–0.2)
Basos: 1 %
EOS (ABSOLUTE): 0.1 10*3/uL (ref 0.0–0.4)
Eos: 1 %
Hematocrit: 49.1 % (ref 37.5–51.0)
Hemoglobin: 16.6 g/dL (ref 13.0–17.7)
Immature Grans (Abs): 0 10*3/uL (ref 0.0–0.1)
Immature Granulocytes: 0 %
Lymphocytes Absolute: 1.9 10*3/uL (ref 0.7–3.1)
Lymphs: 32 %
MCH: 31.4 pg (ref 26.6–33.0)
MCHC: 33.8 g/dL (ref 31.5–35.7)
MCV: 93 fL (ref 79–97)
Monocytes Absolute: 0.7 10*3/uL (ref 0.1–0.9)
Monocytes: 12 %
Neutrophils Absolute: 3.1 10*3/uL (ref 1.4–7.0)
Neutrophils: 54 %
Platelets: 213 10*3/uL (ref 150–450)
RBC: 5.28 x10E6/uL (ref 4.14–5.80)
RDW: 12.1 % (ref 11.6–15.4)
WBC: 5.8 10*3/uL (ref 3.4–10.8)

## 2021-10-20 LAB — LIPID PANEL
Chol/HDL Ratio: 3.8 ratio (ref 0.0–5.0)
Cholesterol, Total: 204 mg/dL — ABNORMAL HIGH (ref 100–199)
HDL: 53 mg/dL (ref >39)
LDL Chol Calc (NIH): 125 mg/dL — ABNORMAL HIGH (ref 0–99)
Triglycerides: 148 mg/dL (ref 0–149)
VLDL Cholesterol Cal: 26 mg/dL (ref 5–40)

## 2021-10-20 MED ORDER — SERTRALINE HCL 25 MG PO TABS
25.0000 mg | ORAL_TABLET | Freq: Every day | ORAL | 1 refills | Status: DC
Start: 2021-10-20 — End: 2022-06-02

## 2021-10-20 MED ORDER — LORAZEPAM 0.5 MG PO TABS: 0.5000 mg | ORAL_TABLET | Freq: Two times a day (BID) | ORAL | 5 refills | Status: DC | PRN

## 2021-10-20 NOTE — Progress Notes (Signed)
   Subjective:    Patient ID: Benjamin Lowe, male    DOB: Aug 26, 1940, 81 y.o.   MRN: [id number]  HPI AWV- Annual Wellness Visit  The patient was seen for their annual wellness visit. The patient's past medical history, surgical history, and family history were reviewed. Pertinent vaccines were reviewed ( tetanus, pneumonia, shingles, flu) The patient's medication list was reviewed and updated.  The height and weight were entered.  BMI recorded in electronic record elsewhere  Cognitive screening was completed. Outcome of Mini - Cog: 4   Falls /depression screening electronically recorded within record elsewhere  Current tobacco usage:chew tabacco (All patients who use tobacco were given written and verbal information on quitting)  Recent listing of emergency department/hospitalizations over the past year were reviewed.  current specialist the patient sees on a regular basis: no   Medicare annual wellness visit patient questionnaire was reviewed.  A written screening schedule for the patient for the next 5-10 years was given. Appropriate discussion of followup regarding next visit was discussed.  Patient finds himself very stressed about one of his cars that are not starting.  He states he just finds himself feeling very anxious and nervous because of that he feels that is causing his blood pressure to go up  He has underlying generalized anxiety disorder takes his medication on a regular basis denies problems with depression.  Patient does his own shopping.  Walks on a daily basis at [employer].  In addition to this denies any chest tightness pressure pain shortness of breath.  Denies any rectal bleeding.  States urine flow good.  Gets up once at night to urinate.  Fixes his own food.  We did discuss about protein intake.  Patient has severe anxiety issues.  Has been on lorazepam for years.  We have been able to have him agree to be on the low-dose serotonin reuptake inhibitor.   Continue lorazepam currently. Review of Systems     Objective:   Physical Exam General-in no acute distress Eyes-no discharge Lungs-respiratory rate normal, CTA CV-no murmurs,RRR Extremities skin warm dry no edema Neuro grossly normal Behavior normal, alert        Assessment & Plan:   1. Encounter for subsequent annual wellness visit (AWV) in Medicare patient Adult wellness-complete.wellness physical was conducted today. Importance of diet and exercise were discussed in detail.  Importance of stress reduction and healthy living were discussed.  In addition to this a discussion regarding safety was also covered.  We also reviewed over immunizations and gave recommendations regarding current immunization needed for age.   In addition to this additional areas were also touched on including: Preventative health exams needed:  Colonoscopy not indicated any further PSA not indicated at his age  Patient was advised yearly wellness exam   2. Elevated blood pressure reading Recheck of blood pressure is still moderately elevated patient needs to do the best he can and not worrying he needs to cut back on salt use-eating a fair amount of tomato sandwiches.  Recheck blood pressure in approximately 6 weeks  3. Generalized anxiety disorder Continue Zoloft continue antianxiety medicine monitor closely for any setbacks  4. Hyperlipidemia, unspecified hyperlipidemia type Mild elevation probably secondary to subpar diet patient will work harder on healthier eating

## 2021-10-20 NOTE — Patient Instructions (Signed)

## 2021-11-19 DIAGNOSIS — L910 Hypertrophic scar: Secondary | ICD-10-CM | POA: Diagnosis not present

## 2021-11-19 DIAGNOSIS — D485 Neoplasm of uncertain behavior of skin: Secondary | ICD-10-CM | POA: Diagnosis not present

## 2021-12-08 ENCOUNTER — Ambulatory Visit (INDEPENDENT_AMBULATORY_CARE_PROVIDER_SITE_OTHER): Payer: Medicare HMO | Admitting: Family Medicine

## 2021-12-08 ENCOUNTER — Encounter: Payer: Self-pay | Admitting: Family Medicine

## 2021-12-08 VITALS — BP 128/84 | HR 70 | Wt 194.0 lb

## 2021-12-08 DIAGNOSIS — R69 Illness, unspecified: Secondary | ICD-10-CM | POA: Diagnosis not present

## 2021-12-08 DIAGNOSIS — F411 Generalized anxiety disorder: Secondary | ICD-10-CM | POA: Diagnosis not present

## 2021-12-08 NOTE — Progress Notes (Signed)
   Subjective:    Patient ID: Benjamin Lowe, male    DOB: [birth year], 81 y.o.   MRN: [id number]  HPI Pt arrives to follow up on blood pressure. Pt states no issues. Pt taking med as directed.   Patient with significant change in his diet cutting out salt staying fairly active Review of Systems     Objective:   Physical Exam  General-in no acute distress Eyes-no discharge Lungs-respiratory rate normal, CTA CV-no murmurs,RRR Extremities skin warm dry no edema Neuro grossly normal Behavior normal, alert  Stress levels are doing good His prized car is still broken down he hopes to get it fixed     Assessment & Plan:  Blood pressure much better He is doing much better Continue current medications Follow-up by [facility]

## 2021-12-30 ENCOUNTER — Other Ambulatory Visit: Payer: Self-pay | Admitting: Family Medicine

## 2021-12-30 ENCOUNTER — Other Ambulatory Visit: Payer: Self-pay

## 2021-12-30 ENCOUNTER — Telehealth: Payer: Self-pay | Admitting: Family Medicine

## 2021-12-30 MED ORDER — LORAZEPAM 0.5 MG PO TABS: 0.5000 mg | ORAL_TABLET | Freq: Two times a day (BID) | ORAL | 5 refills | Status: DC | PRN

## 2021-12-30 NOTE — Telephone Encounter (Signed)
Patient is requesting refill on lorazepam 0.5 mg, he states only has two pills left. [facility]

## 2021-12-30 NOTE — Telephone Encounter (Signed)
5 refills was sent in thank you

## 2021-12-31 ENCOUNTER — Other Ambulatory Visit: Payer: Self-pay | Admitting: Family Medicine

## 2021-12-31 MED ORDER — LORAZEPAM 0.5 MG PO TABS: 0.5000 mg | ORAL_TABLET | Freq: Two times a day (BID) | ORAL | 5 refills | Status: DC | PRN

## 2021-12-31 NOTE — Telephone Encounter (Signed)
Pt would like refill of Lorazepam to go to [facility]. Pt states [facility] states they can not refill it because he is not a regular customer. Pt neighbor called (works at [facility]) and states that [facility] will fill it. Please advise. Thank you

## 2021-12-31 NOTE — Telephone Encounter (Signed)
Patient made aware.

## 2021-12-31 NOTE — Telephone Encounter (Signed)
Prescription was sent to [facility]

## 2022-01-01 NOTE — Telephone Encounter (Signed)
Patient notified

## 2022-04-29 ENCOUNTER — Other Ambulatory Visit: Payer: Self-pay | Admitting: Family Medicine

## 2022-06-02 ENCOUNTER — Ambulatory Visit (INDEPENDENT_AMBULATORY_CARE_PROVIDER_SITE_OTHER): Payer: Medicare HMO | Admitting: Family Medicine

## 2022-06-02 ENCOUNTER — Encounter: Payer: Self-pay | Admitting: Family Medicine

## 2022-06-02 VITALS — BP 152/90 | HR 81 | Wt 199.0 lb

## 2022-06-02 DIAGNOSIS — R69 Illness, unspecified: Secondary | ICD-10-CM | POA: Diagnosis not present

## 2022-06-02 DIAGNOSIS — F411 Generalized anxiety disorder: Secondary | ICD-10-CM | POA: Diagnosis not present

## 2022-06-02 MED ORDER — LORAZEPAM 0.5 MG PO TABS: 0.5000 mg | ORAL_TABLET | Freq: Two times a day (BID) | ORAL | 5 refills | Status: DC | PRN

## 2022-06-02 MED ORDER — SERTRALINE HCL 25 MG PO TABS: 25.0000 mg | ORAL_TABLET | Freq: Every day | ORAL | 1 refills | Status: DC

## 2022-06-02 NOTE — Progress Notes (Signed)
   Subjective:    Patient ID: Benjamin Lowe, male    DOB: [birth year], 82 y.o.   MRN: [id number]  HPI Patient arrives for 7 month follow up. Patient states no concerns or issues today. Anxiety related issues He feels medicine is doing good He states he gets very worked up when he comes to the [place] office He is trying to eat healthy Walks a mile every day.  Denies any chest tightness pressure pain shortness of breath   Review of Systems     Objective:   Physical Exam   General-in no acute distress Eyes-no discharge Lungs-respiratory rate normal, CTA CV-no murmurs,RRR Extremities skin warm dry no edema Neuro grossly normal Behavior normal, alert      Assessment & Plan:  Anxiety related issues Doing well on sertraline In addition to this go ahead with nerve medication Patient denies drowsiness with it Follow-up within 6 months Wellness later this year   Blood pressure is slightly elevated but given his age it does not fall within parameters of acceptable blood pressure I have encouraged him to cut back on salt use try to fit in a little more walking into follow-up later this year to recheck

## 2022-07-09 ENCOUNTER — Ambulatory Visit: Payer: Medicare HMO | Admitting: Family Medicine

## 2022-10-18 DIAGNOSIS — C44311 Basal cell carcinoma of skin of nose: Secondary | ICD-10-CM | POA: Diagnosis not present

## 2022-10-19 ENCOUNTER — Other Ambulatory Visit: Payer: Self-pay

## 2022-10-19 DIAGNOSIS — E781 Pure hyperglyceridemia: Secondary | ICD-10-CM

## 2022-10-19 DIAGNOSIS — Z0000 Encounter for general adult medical examination without abnormal findings: Secondary | ICD-10-CM

## 2022-10-19 DIAGNOSIS — E785 Hyperlipidemia, unspecified: Secondary | ICD-10-CM | POA: Diagnosis not present

## 2022-10-20 ENCOUNTER — Encounter: Payer: Self-pay | Admitting: Family Medicine

## 2022-10-20 LAB — CBC WITH DIFFERENTIAL/PLATELET
Basophils Absolute: 0 10*3/uL (ref 0.0–0.2)
Basos: 1 %
EOS (ABSOLUTE): 0.2 10*3/uL (ref 0.0–0.4)
Eos: 3 %
Hematocrit: 50.2 % (ref 37.5–51.0)
Hemoglobin: 16.7 g/dL (ref 13.0–17.7)
Immature Grans (Abs): 0 10*3/uL (ref 0.0–0.1)
Immature Granulocytes: 0 %
Lymphocytes Absolute: 2.3 10*3/uL (ref 0.7–3.1)
Lymphs: 34 %
MCH: 32.1 pg (ref 26.6–33.0)
MCHC: 33.3 g/dL (ref 31.5–35.7)
MCV: 96 fL (ref 79–97)
Monocytes Absolute: 0.8 10*3/uL (ref 0.1–0.9)
Monocytes: 12 %
Neutrophils Absolute: 3.5 10*3/uL (ref 1.4–7.0)
Neutrophils: 50 %
Platelets: 217 10*3/uL (ref 150–450)
RBC: 5.21 x10E6/uL (ref 4.14–5.80)
RDW: 12.4 % (ref 11.6–15.4)
WBC: 6.8 10*3/uL (ref 3.4–10.8)

## 2022-10-20 LAB — BASIC METABOLIC PANEL
BUN/Creatinine Ratio: 19 (ref 10–24)
BUN: 23 mg/dL (ref 8–27)
CO2: 26 mmol/L (ref 20–29)
Calcium: 9.6 mg/dL (ref 8.6–10.2)
Chloride: 100 mmol/L (ref 96–106)
Creatinine, Ser: 1.22 mg/dL (ref 0.76–1.27)
Glucose: 101 mg/dL — ABNORMAL HIGH (ref 70–99)
Potassium: 4.7 mmol/L (ref 3.5–5.2)
Sodium: 139 mmol/L (ref 134–144)
eGFR: 59 mL/min/{1.73_m2} — ABNORMAL LOW (ref >59)

## 2022-10-20 LAB — LIPID PANEL
Chol/HDL Ratio: 4 ratio (ref 0.0–5.0)
Cholesterol, Total: 204 mg/dL — ABNORMAL HIGH (ref 100–199)
HDL: 51 mg/dL (ref >39)
LDL Chol Calc (NIH): 122 mg/dL — ABNORMAL HIGH (ref 0–99)
Triglycerides: 177 mg/dL — ABNORMAL HIGH (ref 0–149)
VLDL Cholesterol Cal: 31 mg/dL (ref 5–40)

## 2022-11-09 ENCOUNTER — Encounter: Payer: Medicare HMO | Admitting: Family Medicine

## 2022-11-10 ENCOUNTER — Encounter: Payer: Self-pay | Admitting: Family Medicine

## 2022-11-10 ENCOUNTER — Ambulatory Visit (INDEPENDENT_AMBULATORY_CARE_PROVIDER_SITE_OTHER): Payer: Medicare HMO | Admitting: Family Medicine

## 2022-11-10 VITALS — BP 138/88 | Ht 68.0 in

## 2022-11-10 DIAGNOSIS — Z0001 Encounter for general adult medical examination with abnormal findings: Secondary | ICD-10-CM

## 2022-11-10 DIAGNOSIS — M170 Bilateral primary osteoarthritis of knee: Secondary | ICD-10-CM | POA: Diagnosis not present

## 2022-11-10 DIAGNOSIS — Z0000 Encounter for general adult medical examination without abnormal findings: Secondary | ICD-10-CM

## 2022-11-10 NOTE — Progress Notes (Signed)
   Subjective:    Patient ID: Benjamin Lowe, male    DOB: Jan 22, 1941, 82 y.o.   MRN: [id number]  HPI  Knee pain bilateral  AWV- Annual Wellness Visit  The patient was seen for their annual wellness visit. The patient's past medical history, surgical history, and family history were reviewed. Pertinent vaccines were reviewed ( tetanus, pneumonia, shingles, flu) The patient's medication list was reviewed and updated.  The height and weight were entered.  BMI recorded in electronic record elsewhere  Cognitive screening was completed. Outcome of Mini - Cog: 5   Falls /depression screening electronically recorded within record elsewhere  Current tobacco usage:chew tobacco (All patients who use tobacco were given written and verbal information on quitting)  Recent listing of emergency department/hospitalizations over the past year were reviewed.  current specialist the patient sees on a regular basis: no   Medicare annual wellness visit patient questionnaire was reviewed.  A written screening schedule for the patient for the next 5-10 years was given. Appropriate discussion of followup regarding next visit was discussed.      Review of Systems     Objective:   Physical Exam  General-in no acute distress Eyes-no discharge Lungs-respiratory rate normal, CTA CV-no murmurs,RRR Extremities skin warm dry no edema Neuro grossly normal Behavior normal, alert Bilateral knee osteoarthritis noted      Assessment & Plan:  1. Well adult exam Adult wellness-complete.wellness physical was conducted today. Importance of diet and exercise were discussed in detail.  Importance of stress reduction and healthy living were discussed.  In addition to this a discussion regarding safety was also covered.  We also reviewed over immunizations and gave recommendations regarding current immunization needed for age.   In addition to this additional areas were also touched on  including: Preventative health exams needed:  Colonoscopy not indicated  Patient was advised yearly wellness exam   2. Encounter for subsequent annual wellness visit (AWV) in Medicare patient Healthy diet Immunizations discussed in detail Patient not due for anything currently Flu shot this fall along with RSV Information written down for the patient   3. Primary osteoarthritis of both knees Tylenol as needed occasional Aleve would be fine  Generalized anxiety disorder continue the sertraline In addition to this I have encouraged him to cut back on the lorazepam to a half a tablet in the morning and 1 in the evening he has been on this for years.  He thinks he cannot help it but he is a highly anxious person He states medication allows him to function better denies getting drowsy with it.  Patient was warned never to drive if feeling drowsy

## 2022-11-22 DIAGNOSIS — C44311 Basal cell carcinoma of skin of nose: Secondary | ICD-10-CM | POA: Diagnosis not present

## 2022-11-30 ENCOUNTER — Other Ambulatory Visit: Payer: Self-pay | Admitting: Family Medicine

## 2022-12-29 ENCOUNTER — Other Ambulatory Visit: Payer: Self-pay | Admitting: Nurse Practitioner

## 2023-01-03 DIAGNOSIS — Z08 Encounter for follow-up examination after completed treatment for malignant neoplasm: Secondary | ICD-10-CM | POA: Diagnosis not present

## 2023-01-03 DIAGNOSIS — Z85828 Personal history of other malignant neoplasm of skin: Secondary | ICD-10-CM | POA: Diagnosis not present

## 2023-01-31 ENCOUNTER — Other Ambulatory Visit: Payer: Self-pay | Admitting: Nurse Practitioner

## 2023-03-12 ENCOUNTER — Ambulatory Visit
Admission: EM | Admit: 2023-03-12 | Discharge: 2023-03-12 | Disposition: A | Discharge status: Home / Self Care | Payer: Medicare HMO | Attending: Family Medicine | Admitting: Family Medicine

## 2023-03-12 DIAGNOSIS — J329 Chronic sinusitis, unspecified: Secondary | ICD-10-CM

## 2023-03-12 DIAGNOSIS — B9789 Other viral agents as the cause of diseases classified elsewhere: Secondary | ICD-10-CM | POA: Diagnosis not present

## 2023-03-12 MED ORDER — CORICIDIN HBP 10-325-2 MG PO TABS: 1.0000 | ORAL_TABLET | Freq: Three times a day (TID) | ORAL | 0 refills | Status: DC | PRN

## 2023-03-12 MED ORDER — AZELASTINE HCL 0.1 % NA SOLN: 1.0000 | Freq: Two times a day (BID) | NASAL | 1 refills | Status: AC

## 2023-03-12 MED ORDER — BENZONATATE 100 MG PO CAPS: 100.0000 mg | ORAL_CAPSULE | Freq: Three times a day (TID) | ORAL | 0 refills | Status: DC | PRN

## 2023-03-12 NOTE — ED Provider Notes (Signed)
[facility]    CSN: [id number] Arrival date & time: 03/12/23  8416      History   Chief Complaint No chief complaint on file.   HPI Benjamin Lowe is a 82 y.o. male.   Presenting today with 1 day history of sinus pressure, rhinorrhea, ear pressure, mild cough.  Denies fever, chills, body aches, chest pain, shortness of breath, abdominal pain, nausea vomiting or diarrhea.  So far taking some old Augmentin from 2022 that he had leftover, otherwise not trying anything for symptoms.  No known history of chronic pulmonary disease.  No known sick contacts but was around a lot of people eating dinner a few days ago.    Past Medical History:  Diagnosis Date   Depression    "when I get nervous"    Patient Active Problem List   Diagnosis Date Noted   Panic attack 06/02/2018   Guaiac + stool 11/30/2016   Hyperlipidemia 11/04/2014   GERD (gastroesophageal reflux disease) 05/16/2014   Generalized anxiety disorder 10/23/2012    Past Surgical History:  Procedure Laterality Date   COLONOSCOPY     COLONOSCOPY N/A 12/27/2016   Procedure: COLONOSCOPY;  Surgeon: Malissa Hippo, MD;  Location: [facility];  Service: Endoscopy;  Laterality: N/A;  1:00   KNEE SURGERY         Home Medications    Prior to Admission medications   Medication Sig Start Date End Date Taking? Authorizing Provider  azelastine (ASTELIN) 0.1 % nasal spray Place 1 spray into both nostrils 2 (two) times daily. Use in each nostril as directed 03/12/23  Yes Particia Nearing, PA-C  benzonatate (TESSALON) 100 MG capsule Take 1 capsule (100 mg total) by mouth 3 (three) times daily as needed for cough. 03/12/23  Yes Particia Nearing, PA-C  DM-APAP-CPM (CORICIDIN HBP) 10-325-2 MG TABS Take 1 tablet by mouth 3 (three) times daily as needed. 03/12/23  Yes Particia Nearing, PA-C  LORazepam (ATIVAN) 0.5 MG tablet Take 1 tablet by mouth twice daily as needed 01/31/23   Babs Sciara, MD   pantoprazole (PROTONIX) 40 MG tablet Take 1 tablet (40 mg total) by mouth daily as needed. Patient not taking: Reported on 06/02/2022 10/21/20   Babs Sciara, MD  sertraline (ZOLOFT) 25 MG tablet Take 1 tablet (25 mg total) by mouth daily. 06/02/22   Babs Sciara, MD  sildenafil (VIAGRA) 100 MG tablet TAKE ONE-HALF TO ONE TABLET BY MOUTH ONCE DAILY AS NEEDED Patient not taking: Reported on 06/02/2022 05/13/20   Babs Sciara, MD  simethicone (MYLICON) 125 MG chewable tablet Chew 125 mg by mouth daily as needed for flatulence (gas). Patient not taking: Reported on 06/02/2022    [provider]  sucralfate (CARAFATE) 1 g tablet TAKE 1 TABLET BY MOUTH 4 TIMES DAILY (WITH MEALS AND AT BEDTIME) Patient not taking: Reported on 06/02/2022 04/06/21   Babs Sciara, MD    Family History Family History  Problem Relation Age of Onset   Diabetes type II Mother    Other Father    Cancer - Colon Father    Parkinsonism Father     Social History Social History   Tobacco Use   Smoking status: Former    Types: Cigarettes   Smokeless tobacco: Current    Types: Chew  Substance Use Topics   Alcohol use: No   Drug use: No     Allergies   Azithromycin   Review of Systems Review of  Systems PER HPI  Physical Exam Triage Vital Signs ED Triage Vitals  Encounter Vitals Group     BP 03/12/23 0842 (!) 170/93     Systolic BP Percentile --      Diastolic BP Percentile --      Pulse Rate 03/12/23 0842 85     Resp 03/12/23 0842 18     Temp 03/12/23 0842 98.3 F (36.8 C)     Temp Source 03/12/23 0842 Oral     SpO2 03/12/23 0842 94 %     Weight --      Height --      Head Circumference --      Peak Flow --      Pain Score 03/12/23 0843 0     Pain Loc --      Pain Education --      Exclude from Growth Chart --    No data found.  Updated Vital Signs BP (!) 170/93 (BP Location: Right Arm)   Pulse 85   Temp 98.3 F (36.8 C) (Oral)   Resp 18   SpO2 94%   Visual Acuity Right  Eye Distance:   Left Eye Distance:   Bilateral Distance:    Right Eye Near:   Left Eye Near:    Bilateral Near:     Physical Exam Vitals and nursing note reviewed.  Constitutional:      Appearance: He is well-developed.  HENT:     Head: Atraumatic.     Right Ear: External ear normal.     Left Ear: External ear normal.     Nose: Rhinorrhea present.     Mouth/Throat:     Pharynx: Posterior oropharyngeal erythema present. No oropharyngeal exudate.  Eyes:     Conjunctiva/sclera: Conjunctivae normal.     Pupils: Pupils are equal, round, and reactive to light.  Cardiovascular:     Rate and Rhythm: Normal rate and regular rhythm.  Pulmonary:     Effort: Pulmonary effort is normal. No respiratory distress.     Breath sounds: No wheezing or rales.  Musculoskeletal:        General: Normal range of motion.     Cervical back: Normal range of motion and neck supple.  Lymphadenopathy:     Cervical: No cervical adenopathy.  Skin:    General: Skin is warm and dry.  Neurological:     Mental Status: He is alert and oriented to person, place, and time.  Psychiatric:        Behavior: Behavior normal.      UC Treatments / Results  Labs (all labs ordered are listed, but only abnormal results are displayed) Labs Reviewed - No data to display  EKG   Radiology No results found.  Procedures Procedures (including critical care time)  Medications Ordered in UC Medications - No data to display  Initial Impression / Assessment and Plan / UC Course  I have reviewed the triage vital signs and the nursing notes.  Pertinent labs & imaging results that were available during my care of the patient were reviewed by me and considered in my medical decision making (see chart for details).     Suspect viral sinusitis.  Treat with Astelin, Coricidin HBP, Mucinex, sinus rinses and supportive home care.  Return for worsening symptoms.  Final Clinical Impressions(s) / UC Diagnoses   Final  diagnoses:  Viral sinusitis     Discharge Instructions      I have sent in a nasal spray to help  dry the drainage and help with your ear pressure.  I have also sent a cough medication as well as a decongestant medication that is safe for high blood pressure.  You may use saline sinus rinses, humidifiers, plain Mucinex and hot teas as well.    ED Prescriptions     Medication Sig Dispense Auth. Provider   azelastine (ASTELIN) 0.1 % nasal spray Place 1 spray into both nostrils 2 (two) times daily. Use in each nostril as directed 30 mL Particia Nearing, PA-C   DM-APAP-CPM (CORICIDIN HBP) 10-325-2 MG TABS Take 1 tablet by mouth 3 (three) times daily as needed. 30 tablet Particia Nearing, [place]   benzonatate (TESSALON) 100 MG capsule Take 1 capsule (100 mg total) by mouth 3 (three) times daily as needed for cough. 21 capsule Particia Nearing, [place]      PDMP not reviewed this encounter.   Particia Nearing, [place] 03/12/23 [phone]

## 2023-03-12 NOTE — Discharge Instructions (Signed)
I have sent in a nasal spray to help dry the drainage and help with your ear pressure.  I have also sent a cough medication as well as a decongestant medication that is safe for high blood pressure.  You may use saline sinus rinses, humidifiers, plain Mucinex and hot teas as well.

## 2023-03-12 NOTE — ED Triage Notes (Signed)
Pt reports pt reports his ears are clogged, facial pressure, and runny nose  x 1 day.

## 2023-03-16 ENCOUNTER — Ambulatory Visit (INDEPENDENT_AMBULATORY_CARE_PROVIDER_SITE_OTHER): Payer: Medicare HMO | Admitting: Family Medicine

## 2023-03-16 VITALS — BP 136/76 | HR 83 | Temp 98.2°F | Ht 68.0 in | Wt 199.0 lb

## 2023-03-16 DIAGNOSIS — J019 Acute sinusitis, unspecified: Secondary | ICD-10-CM | POA: Diagnosis not present

## 2023-03-16 DIAGNOSIS — F411 Generalized anxiety disorder: Secondary | ICD-10-CM | POA: Diagnosis not present

## 2023-03-16 DIAGNOSIS — E785 Hyperlipidemia, unspecified: Secondary | ICD-10-CM | POA: Diagnosis not present

## 2023-03-16 MED ORDER — AMOXICILLIN-POT CLAVULANATE 875-125 MG PO TABS
1.0000 | ORAL_TABLET | Freq: Two times a day (BID) | ORAL | 0 refills | Status: DC
Start: 2023-03-16 — End: 2023-06-08

## 2023-03-16 MED ORDER — SERTRALINE HCL 25 MG PO TABS: 25.0000 mg | ORAL_TABLET | Freq: Every day | ORAL | 1 refills | Status: DC

## 2023-03-16 NOTE — Progress Notes (Signed)
   Subjective:    Patient ID: Benjamin Lowe, male    DOB: 1940-11-04, 82 y.o.   MRN: [id number]   History of Present Illness   The patient, with a history of hypertension, presented with symptoms of a sinus infection that began on a Friday night after exposure to air conditioning in his car. He experienced a runny nose and postnasal drip, which he initially attempted to manage with salt water. He also reported taking leftover antibiotics, which he believed improved his symptoms significantly. The patient denied any breathing problems in his lungs but reported discomfort in his eyes, ears, and nose. He also noted that his symptoms were positional, with increased discomfort when lying on either side.  The patient also mentioned receiving a flu shot and RSV vaccine in October at a local [facility]. [facility] for [facility] due to the quick service. He also mentioned a recent blood pressure reading of 162/83, which he considered good.         Review of Systems     Objective:    Physical Exam   VITALS: BP- 134/86 CHEST: lungs clear to auscultation CARDIOVASCULAR: normal heart sounds     General-in no acute distress Eyes-no discharge Lungs-respiratory rate normal, CTA CV-no murmurs,RRR Extremities skin warm dry no edema Neuro grossly normal Behavior normal, alert       Assessment & Plan:  Assessment and Plan    Upper Respiratory Infection Symptoms started on Friday with rhinorrhea and postnasal drip. Patient self-treated with leftover Augmentin and reports improvement. No signs of pneumonia on examination. -Prescribe Augmentin for 7 more days to complete a full course of antibiotics.  Hypertension Blood pressure measured at 134/86 in the office, which is within the target range. -Continue current antihypertensive regimen.  General Health Maintenance -Flu and RSV vaccines administered in October at [facility]. -Continue to monitor blood pressure regularly.      1.  Hyperlipidemia, unspecified hyperlipidemia type (Primary) Check labs next year healthy diet recommended  2. Generalized anxiety disorder Patient does use his lorazepam on a as needed basis patient was encouraged to try to cut back to half tablet twice a day over time  3. Acute rhinosinusitis Antibiotic twice daily for 7 days

## 2023-03-28 ENCOUNTER — Telehealth: Payer: Self-pay

## 2023-03-28 ENCOUNTER — Other Ambulatory Visit: Payer: Self-pay

## 2023-03-28 MED ORDER — AMOXICILLIN 500 MG PO CAPS: 500.0000 mg | ORAL_CAPSULE | Freq: Three times a day (TID) | ORAL | 0 refills | Status: AC

## 2023-03-28 NOTE — Telephone Encounter (Signed)
May go with plain amoxicillin 500 mg 1 taken 3 times daily for an additional 5 days It is important to realize that respiratory illnesses can often have lingering congestion and postnasal drip for several weeks afterwards if having any severe problems or worsening issues recommend follow-up office visit

## 2023-03-28 NOTE — Telephone Encounter (Signed)
Patient has been made aware per drs notes and recommendations. 

## 2023-03-28 NOTE — Telephone Encounter (Signed)
Pt is requesting a refill on amoxicillin-clavulanate (AUGMENTIN) 875-125 MG tablet I have explain to patient that we do not do refills he wanted me to still request from Dr Lorin Picket said he still is not getting better

## 2023-05-13 ENCOUNTER — Ambulatory Visit: Payer: Medicare HMO | Admitting: Family Medicine

## 2023-06-08 ENCOUNTER — Ambulatory Visit (INDEPENDENT_AMBULATORY_CARE_PROVIDER_SITE_OTHER): Payer: Medicare HMO | Admitting: Family Medicine

## 2023-06-08 VITALS — BP 134/72 | HR 87 | Temp 97.9°F | Ht 68.0 in | Wt 200.4 lb

## 2023-06-08 DIAGNOSIS — F411 Generalized anxiety disorder: Secondary | ICD-10-CM

## 2023-06-08 DIAGNOSIS — R944 Abnormal results of kidney function studies: Secondary | ICD-10-CM

## 2023-06-08 DIAGNOSIS — E785 Hyperlipidemia, unspecified: Secondary | ICD-10-CM

## 2023-06-08 MED ORDER — SERTRALINE HCL 25 MG PO TABS: 25.0000 mg | ORAL_TABLET | Freq: Every day | ORAL | 1 refills | Status: DC

## 2023-06-08 MED ORDER — LORAZEPAM 0.5 MG PO TABS: 0.5000 mg | ORAL_TABLET | Freq: Two times a day (BID) | ORAL | 5 refills | Status: DC | PRN

## 2023-06-08 NOTE — Progress Notes (Signed)
   Subjective:    Patient ID: Benjamin Lowe, male    DOB: 08-20-1940, 83 y.o.   MRN: [id number]  Discussed the use of AI scribe software for clinical note transcription with the patient, who gave verbal consent to proceed.  History of Present Illness   The patient presents for a routine follow-up visit.  He experiences ongoing knee pain that limits his ability to walk. He manages the pain by taking half a dose of Aleve every three days, which allows him to walk again.  No current issues with breathing, regurgitation, or reflux symptoms. He attributes the improvement in reflux to eating tangerines regularly, consuming about a bag per week. He previously took medication for reflux for four years but no longer requires it.  He is currently taking Zoloft daily and lorazepam twice a day, which he has been on for years and reports doing well with it. He mentions occasional feelings of being 'disgusted' with himself for not completing tasks, such as cleaning his car, but attributes this to laziness rather than mood issues.  He maintains a consistent diet, typically eating eggs, bacon, toast, and coffee for breakfast, a banana sandwich for lunch, and a variety of beans, greens, and cabbage for dinner. He enjoys a 'brown meal' popsicle at night before bed. He notes a recent weight gain of one pound.  Socially, he remains active by driving and performing yard work, such as [employer], corporate. He prefers staying away from people to avoid illness, mentioning he has received flu and COVID vaccinations.         Review of Systems     Objective:    Physical Exam   VITALS: BP- 134/82     General-in no acute distress Eyes-no discharge Lungs-respiratory rate normal, CTA CV-no murmurs,RRR Extremities skin warm dry no edema Neuro grossly normal Behavior normal, alert       Assessment & Plan:  Assessment and Plan    Knee Pain Chronic knee pain managed with intermittent Aleve use. - Continue  Aleve as needed for knee pain.  Generalized Anxiety Disorder Anxiety well-managed with Zoloft and lorazepam. - Continue Zoloft and lorazepam as prescribed. - Send refills for Zoloft and lorazepam.  Gastroesophageal Reflux Disease (GERD) Symptoms resolved with dietary changes. No current issues.  General Health Maintenance Blood pressure controlled. Vaccinations up to date. No immediate need for blood work. - Schedule blood work a week before wellness visit in August. - Prepare paperwork for blood work. - Schedule follow-up visit in August.       He has been on lorazepam for years.  Currently right now functioning well with it.  We are choosing to continue this for now

## 2023-06-08 NOTE — Patient Instructions (Signed)
 Do your labs before your visit in August

## 2023-06-10 ENCOUNTER — Ambulatory Visit: Payer: Medicare HMO | Admitting: Family Medicine

## 2023-06-22 ENCOUNTER — Other Ambulatory Visit: Payer: Self-pay | Admitting: Family Medicine

## 2023-06-22 NOTE — Telephone Encounter (Signed)
 Patient has 90 day supply of zoloft sent in on 06/08/23 with 1 refill, called and spoke with [facility] pharmacy staff member, Brett Canales, he confirmed medication was filled and picked up today. Cancelled reorder request and closing encounter.

## 2023-06-22 NOTE — Telephone Encounter (Signed)
 Copied from CRM [id number]. Topic: Clinical - Medication Refill >> Jun 22, 2023 11:35 AM Priscille Loveless wrote: Most Recent Primary Care Visit:  Provider: Lilyan Punt A  Department: [facility]  Visit Type: OFFICE VISIT  Date: 06/08/2023  Medication: sertraline (ZOLOFT) 25 MG tablet  Has the patient contacted their pharmacy? Yes  Is this the correct pharmacy for this prescription? Yes If no, delete pharmacy and type the correct one.  This is the patient's preferred pharmacy:   [facility] [address], [place] - [address] [place] [address] [place] [address] Phone: [phone] Fax: [phone]    Has the prescription been filled recently? Yes  Is the patient out of the medication? Yes  Has the patient been seen for an appointment in the last year OR does the patient have an upcoming appointment? Yes  Can we respond through MyChart? No  Agent: Please be advised that Rx refills may take up to 3 business days. We ask that you follow-up with your pharmacy.

## 2023-07-05 ENCOUNTER — Telehealth: Payer: Self-pay | Admitting: *Deleted

## 2023-07-05 NOTE — Telephone Encounter (Signed)
 Yes he is due I agree that pharmacy can do this shot and take care of this particular need

## 2023-07-05 NOTE — Telephone Encounter (Signed)
 Copied from CRM [id number]. Topic: Clinical - Medical Advice >> Jul 05, 2023 11:44 AM Fonda Kinder J wrote: Reason for CRM: Pt states he went to [facility] to pick up his medicine and was advised he needed a Tetanus shot. He wants to ask Dr. Meredith Leeds if he needs this shot and should he take it? Please advise

## 2023-07-05 NOTE — Telephone Encounter (Signed)
 Called pt and informed him Dr Gerda Diss agrees he should get the immunization since he is due. Pt verbalized understanding

## 2023-08-01 DIAGNOSIS — Z08 Encounter for follow-up examination after completed treatment for malignant neoplasm: Secondary | ICD-10-CM | POA: Diagnosis not present

## 2023-08-01 DIAGNOSIS — Z85828 Personal history of other malignant neoplasm of skin: Secondary | ICD-10-CM | POA: Diagnosis not present

## 2023-10-19 DIAGNOSIS — R944 Abnormal results of kidney function studies: Secondary | ICD-10-CM | POA: Diagnosis not present

## 2023-10-19 DIAGNOSIS — E785 Hyperlipidemia, unspecified: Secondary | ICD-10-CM | POA: Diagnosis not present

## 2023-10-20 LAB — BASIC METABOLIC PANEL WITH GFR
BUN/Creatinine Ratio: 19 (ref 10–24)
BUN: 20 mg/dL (ref 8–27)
CO2: 23 mmol/L (ref 20–29)
Calcium: 9.6 mg/dL (ref 8.6–10.2)
Chloride: 101 mmol/L (ref 96–106)
Creatinine, Ser: 1.03 mg/dL (ref 0.76–1.27)
Glucose: 94 mg/dL (ref 70–99)
Potassium: 4.5 mmol/L (ref 3.5–5.2)
Sodium: 140 mmol/L (ref 134–144)
eGFR: 72 mL/min/1.73 (ref >59)

## 2023-10-20 LAB — LIPID PANEL
Chol/HDL Ratio: 4 ratio (ref 0.0–5.0)
Cholesterol, Total: 187 mg/dL (ref 100–199)
HDL: 47 mg/dL (ref >39)
LDL Chol Calc (NIH): 105 mg/dL — ABNORMAL HIGH (ref 0–99)
Triglycerides: 201 mg/dL — ABNORMAL HIGH (ref 0–149)
VLDL Cholesterol Cal: 35 mg/dL (ref 5–40)

## 2023-10-21 ENCOUNTER — Ambulatory Visit: Payer: Self-pay | Admitting: Family Medicine

## 2023-11-17 ENCOUNTER — Ambulatory Visit: Admitting: Family Medicine

## 2023-11-17 VITALS — BP 136/88 | HR 88 | Temp 97.7°F | Ht 68.0 in | Wt 204.4 lb

## 2023-11-17 DIAGNOSIS — Z0000 Encounter for general adult medical examination without abnormal findings: Secondary | ICD-10-CM

## 2023-11-17 DIAGNOSIS — Z0001 Encounter for general adult medical examination with abnormal findings: Secondary | ICD-10-CM | POA: Diagnosis not present

## 2023-11-17 DIAGNOSIS — F411 Generalized anxiety disorder: Secondary | ICD-10-CM | POA: Diagnosis not present

## 2023-11-17 NOTE — Progress Notes (Signed)
   Subjective:    Patient ID: Benjamin Lowe, male    DOB: [birth year], 83 y.o.   MRN: [id number]  HPI The patient comes in today for a wellness visit.    A review of their health history was completed.  A review of medications was also completed.  Any needed refills; Not at this time  Eating habits: Good  Falls/  MVA accidents in past few months: No  Regular exercise: None  Specialist pt sees on regular basis: No  Preventative health issues were discussed.   Additional concerns:  Patient states he is not sleeping well, maybe 4-5 hours   Review of Systems     Objective:   Physical Exam General-in no acute distress Eyes-no discharge Lungs-respiratory rate normal, CTA CV-no murmurs,RRR Extremities skin warm dry no edema Neuro grossly normal Behavior normal, alert  Results for orders placed or performed in visit on 06/08/23  Basic Metabolic Panel   Collection Time: 10/19/23  8:05 AM  Result Value Ref Range   Glucose 94 70 - 99 mg/dL   BUN 20 8 - 27 mg/dL   Creatinine, Ser 8.96 0.76 - 1.27 mg/dL   eGFR 72 >40 fO/fpw/8.26   BUN/Creatinine Ratio 19 10 - 24   Sodium 140 134 - 144 mmol/L   Potassium 4.5 3.5 - 5.2 mmol/L   Chloride 101 96 - 106 mmol/L   CO2 23 20 - 29 mmol/L   Calcium 9.6 8.6 - 10.2 mg/dL  Lipid Panel   Collection Time: 10/19/23  8:05 AM  Result Value Ref Range   Cholesterol, Total 187 100 - 199 mg/dL   Triglycerides 798 (H) 0 - 149 mg/dL   HDL 47 >60 mg/dL   VLDL Cholesterol Cal 35 5 - 40 mg/dL   LDL Chol Calc (NIH) 894 (H) 0 - 99 mg/dL   Chol/HDL Ratio 4.0 0.0 - 5.0 ratio   PSA not indicated       Assessment & Plan:  Annual wellness visit-cognitively doing well No falls except for when he missed a step at church No depression  Adult wellness-complete.wellness physical was conducted today. Importance of diet and exercise were discussed in detail.  Importance of stress reduction and healthy living were discussed.  In addition to  this a discussion regarding safety was also covered.  We also reviewed over immunizations and gave recommendations regarding current immunization needed for age.   In addition to this additional areas were also touched on including: Preventative health exams needed:  Colonoscopy not indicated  Patient was advised yearly wellness exam   We did discuss the importance of tapering down on anxiety medicine and try to get to just utilizing the medication at nighttime to help him rest

## 2023-12-14 ENCOUNTER — Other Ambulatory Visit: Payer: Self-pay | Admitting: Family Medicine

## 2024-01-27 ENCOUNTER — Other Ambulatory Visit: Payer: Self-pay

## 2024-01-27 ENCOUNTER — Other Ambulatory Visit: Payer: Self-pay | Admitting: Family Medicine

## 2024-01-30 ENCOUNTER — Telehealth: Payer: Self-pay | Admitting: Family Medicine

## 2024-01-30 ENCOUNTER — Telehealth: Payer: Self-pay

## 2024-01-30 NOTE — Telephone Encounter (Signed)
 Error already filled on 1031/25

## 2024-01-30 NOTE — Telephone Encounter (Signed)
 Copied from CRM #[id number]. Topic: Clinical - Medication Question >> Jan 30, 2024 11:14 AM Benjamin Lowe wrote: Reason for CRM: pt was calling about Renewed LORazepam  0.5 mg Oral 2 times daily PRN I asked if he was out of meds, and he did not answer

## 2024-01-31 ENCOUNTER — Other Ambulatory Visit: Payer: Self-pay | Admitting: Family Medicine

## 2024-01-31 ENCOUNTER — Other Ambulatory Visit: Payer: Self-pay

## 2024-01-31 MED ORDER — LORAZEPAM 0.5 MG PO TABS: 0.5000 mg | ORAL_TABLET | Freq: Two times a day (BID) | ORAL | 4 refills | Status: AC | PRN

## 2024-01-31 NOTE — Telephone Encounter (Signed)
 Prescription sent in

## 2024-03-01 ENCOUNTER — Other Ambulatory Visit: Payer: Self-pay | Admitting: Family Medicine

## 2024-05-18 ENCOUNTER — Ambulatory Visit: Admitting: Family Medicine
